# Patient Record
Sex: Male | Born: 1960 | Race: White | Hispanic: No | Marital: Married | Smoking: Never smoker
Health system: Southern US, Community
[De-identification: ages and names within clinical notes are randomized; demographics above are authoritative.]

## PROBLEM LIST (undated history)

## (undated) DIAGNOSIS — R4586 Emotional lability: Secondary | ICD-10-CM

## (undated) DIAGNOSIS — Z86010 Personal history of colonic polyps: Secondary | ICD-10-CM

## (undated) DIAGNOSIS — R51 Headache: Secondary | ICD-10-CM

## (undated) DIAGNOSIS — K219 Gastro-esophageal reflux disease without esophagitis: Secondary | ICD-10-CM

## (undated) DIAGNOSIS — E781 Pure hyperglyceridemia: Secondary | ICD-10-CM

## (undated) DIAGNOSIS — J309 Allergic rhinitis, unspecified: Secondary | ICD-10-CM

## (undated) DIAGNOSIS — E785 Hyperlipidemia, unspecified: Secondary | ICD-10-CM

## (undated) HISTORY — DX: Hyperlipidemia, unspecified: E78.5

## (undated) HISTORY — DX: Personal history of colonic polyps: Z86.010

## (undated) HISTORY — DX: Allergic rhinitis, unspecified: J30.9

## (undated) HISTORY — PX: EYE SURGERY: SHX253

## (undated) HISTORY — DX: Gastro-esophageal reflux disease without esophagitis: K21.9

## (undated) HISTORY — DX: Pure hyperglyceridemia: E78.1

---

## 1995-05-16 HISTORY — PX: OTHER SURGICAL HISTORY: SHX169

## 2000-06-06 ENCOUNTER — Ambulatory Visit (HOSPITAL_BASED_OUTPATIENT_CLINIC_OR_DEPARTMENT_OTHER): Admission: RE | Admit: 2000-06-06 | Discharge: 2000-06-06 | Payer: Self-pay | Admitting: Otolaryngology

## 2000-07-11 ENCOUNTER — Encounter (INDEPENDENT_AMBULATORY_CARE_PROVIDER_SITE_OTHER): Payer: Self-pay | Admitting: Specialist

## 2000-07-11 ENCOUNTER — Other Ambulatory Visit: Admission: RE | Admit: 2000-07-11 | Discharge: 2000-07-11 | Payer: Self-pay | Admitting: Otolaryngology

## 2002-09-05 ENCOUNTER — Encounter: Payer: Self-pay | Admitting: Internal Medicine

## 2002-09-05 ENCOUNTER — Encounter: Admission: RE | Admit: 2002-09-05 | Discharge: 2002-09-05 | Payer: Self-pay | Admitting: Internal Medicine

## 2004-02-24 ENCOUNTER — Encounter: Admission: RE | Admit: 2004-02-24 | Discharge: 2004-02-24 | Payer: Self-pay | Admitting: General Surgery

## 2004-10-13 ENCOUNTER — Ambulatory Visit: Payer: Self-pay | Admitting: Adult Health

## 2005-05-12 ENCOUNTER — Ambulatory Visit: Payer: Self-pay | Admitting: Internal Medicine

## 2005-05-17 ENCOUNTER — Ambulatory Visit: Payer: Self-pay | Admitting: Internal Medicine

## 2006-09-04 ENCOUNTER — Ambulatory Visit: Payer: Self-pay | Admitting: Internal Medicine

## 2006-09-04 LAB — CONVERTED CEMR LAB
ALT: 30 units/L (ref 0–40)
AST: 23 units/L (ref 0–37)
Albumin: 3.8 g/dL (ref 3.5–5.2)
Alkaline Phosphatase: 67 units/L (ref 39–117)
BUN: 10 mg/dL (ref 6–23)
Basophils Absolute: 0 10*3/uL (ref 0.0–0.1)
Basophils Relative: 0.9 % (ref 0.0–1.0)
Bilirubin Urine: NEGATIVE
Bilirubin, Direct: 0.1 mg/dL (ref 0.0–0.3)
CO2: 31 meq/L (ref 19–32)
Calcium: 9.1 mg/dL (ref 8.4–10.5)
Chloride: 107 meq/L (ref 96–112)
Cholesterol: 193 mg/dL (ref 0–200)
Creatinine, Ser: 0.9 mg/dL (ref 0.4–1.5)
Direct LDL: 67 mg/dL
Eosinophils Absolute: 0.1 10*3/uL (ref 0.0–0.6)
Eosinophils Relative: 2.9 % (ref 0.0–5.0)
GFR calc Af Amer: 118 mL/min
GFR calc non Af Amer: 97 mL/min
Glucose, Bld: 97 mg/dL (ref 70–99)
HCT: 44.4 % (ref 39.0–52.0)
HDL: 34.7 mg/dL — ABNORMAL LOW (ref >39.0)
Hemoglobin, Urine: NEGATIVE
Hemoglobin: 14.9 g/dL (ref 13.0–17.0)
Ketones, ur: NEGATIVE mg/dL
Leukocytes, UA: NEGATIVE
Lymphocytes Relative: 34.4 % (ref 12.0–46.0)
MCHC: 33.6 g/dL (ref 30.0–36.0)
MCV: 86.6 fL (ref 78.0–100.0)
Monocytes Absolute: 0.4 10*3/uL (ref 0.2–0.7)
Monocytes Relative: 7.9 % (ref 3.0–11.0)
Neutro Abs: 2.6 10*3/uL (ref 1.4–7.7)
Neutrophils Relative %: 53.9 % (ref 43.0–77.0)
Nitrite: NEGATIVE
PSA: 0.51 ng/mL (ref 0.10–4.00)
Platelets: 147 10*3/uL — ABNORMAL LOW (ref 150–400)
Potassium: 3.9 meq/L (ref 3.5–5.1)
RBC: 5.13 M/uL (ref 4.22–5.81)
RDW: 11.6 % (ref 11.5–14.6)
Sodium: 142 meq/L (ref 135–145)
Specific Gravity, Urine: 1.025 (ref 1.000–1.03)
TSH: 1.09 microintl units/mL (ref 0.35–5.50)
Total Bilirubin: 0.8 mg/dL (ref 0.3–1.2)
Total CHOL/HDL Ratio: 5.6
Total Protein, Urine: NEGATIVE mg/dL
Total Protein: 7.1 g/dL (ref 6.0–8.3)
Triglycerides: 610 mg/dL (ref 0–149)
Urine Glucose: NEGATIVE mg/dL
Urobilinogen, UA: 0.2 (ref 0.0–1.0)
VLDL: 122 mg/dL — ABNORMAL HIGH (ref 0–40)
WBC: 4.8 10*3/uL (ref 4.5–10.5)
pH: 6 (ref 5.0–8.0)

## 2007-07-05 ENCOUNTER — Encounter: Payer: Self-pay | Admitting: Internal Medicine

## 2007-08-26 ENCOUNTER — Encounter: Payer: Self-pay | Admitting: Internal Medicine

## 2007-09-05 ENCOUNTER — Encounter: Payer: Self-pay | Admitting: Internal Medicine

## 2007-11-11 ENCOUNTER — Encounter: Payer: Self-pay | Admitting: Internal Medicine

## 2008-08-31 ENCOUNTER — Encounter: Payer: Self-pay | Admitting: Internal Medicine

## 2008-10-05 ENCOUNTER — Ambulatory Visit: Payer: Self-pay | Admitting: Internal Medicine

## 2008-10-05 LAB — CONVERTED CEMR LAB
ALT: 25 units/L (ref 0–53)
AST: 24 units/L (ref 0–37)
Albumin: 4.2 g/dL (ref 3.5–5.2)
Alkaline Phosphatase: 57 units/L (ref 39–117)
BUN: 18 mg/dL (ref 6–23)
Basophils Absolute: 0 10*3/uL (ref 0.0–0.1)
Basophils Relative: 0.7 % (ref 0.0–3.0)
Bilirubin Urine: NEGATIVE
Bilirubin, Direct: 0.1 mg/dL (ref 0.0–0.3)
CO2: 29 meq/L (ref 19–32)
Calcium: 9.2 mg/dL (ref 8.4–10.5)
Chloride: 106 meq/L (ref 96–112)
Cholesterol: 186 mg/dL (ref 0–200)
Creatinine, Ser: 1.1 mg/dL (ref 0.4–1.5)
Eosinophils Absolute: 0.1 10*3/uL (ref 0.0–0.7)
Eosinophils Relative: 2.6 % (ref 0.0–5.0)
GFR calc non Af Amer: 76.34 mL/min (ref >60)
Glucose, Bld: 88 mg/dL (ref 70–99)
HCT: 41.7 % (ref 39.0–52.0)
HDL: 35.7 mg/dL — ABNORMAL LOW (ref >39.00)
Hemoglobin, Urine: NEGATIVE
Hemoglobin: 14.7 g/dL (ref 13.0–17.0)
Ketones, ur: NEGATIVE mg/dL
LDL Cholesterol: 115 mg/dL — ABNORMAL HIGH (ref 0–99)
Leukocytes, UA: NEGATIVE
Lymphocytes Relative: 40.4 % (ref 12.0–46.0)
Lymphs Abs: 1.9 10*3/uL (ref 0.7–4.0)
MCHC: 35.1 g/dL (ref 30.0–36.0)
MCV: 86.3 fL (ref 78.0–100.0)
Monocytes Absolute: 0.4 10*3/uL (ref 0.1–1.0)
Monocytes Relative: 9 % (ref 3.0–12.0)
Neutro Abs: 2.2 10*3/uL (ref 1.4–7.7)
Neutrophils Relative %: 47.3 % (ref 43.0–77.0)
Nitrite: NEGATIVE
PSA: 0.46 ng/mL (ref 0.10–4.00)
Platelets: 122 10*3/uL — ABNORMAL LOW (ref 150.0–400.0)
Potassium: 3.8 meq/L (ref 3.5–5.1)
RBC: 4.83 M/uL (ref 4.22–5.81)
RDW: 11.2 % — ABNORMAL LOW (ref 11.5–14.6)
Sodium: 141 meq/L (ref 135–145)
Specific Gravity, Urine: >=1.03 (ref 1.000–1.030)
TSH: 1.49 microintl units/mL (ref 0.35–5.50)
Total Bilirubin: 1.3 mg/dL — ABNORMAL HIGH (ref 0.3–1.2)
Total CHOL/HDL Ratio: 5
Total Protein, Urine: NEGATIVE mg/dL
Total Protein: 7.3 g/dL (ref 6.0–8.3)
Triglycerides: 175 mg/dL — ABNORMAL HIGH (ref 0.0–149.0)
Urine Glucose: NEGATIVE mg/dL
Urobilinogen, UA: 0.2 (ref 0.0–1.0)
VLDL: 35 mg/dL (ref 0.0–40.0)
WBC: 4.6 10*3/uL (ref 4.5–10.5)
pH: 5 (ref 5.0–8.0)

## 2008-10-07 ENCOUNTER — Ambulatory Visit: Payer: Self-pay | Admitting: Internal Medicine

## 2008-10-07 DIAGNOSIS — E785 Hyperlipidemia, unspecified: Secondary | ICD-10-CM

## 2008-10-07 DIAGNOSIS — Z860100 Personal history of colon polyps, unspecified: Secondary | ICD-10-CM | POA: Insufficient documentation

## 2008-10-07 DIAGNOSIS — J309 Allergic rhinitis, unspecified: Secondary | ICD-10-CM

## 2008-10-07 DIAGNOSIS — K219 Gastro-esophageal reflux disease without esophagitis: Secondary | ICD-10-CM

## 2008-10-07 HISTORY — DX: Gastro-esophageal reflux disease without esophagitis: K21.9

## 2008-10-07 HISTORY — DX: Hyperlipidemia, unspecified: E78.5

## 2008-10-07 HISTORY — DX: Personal history of colonic polyps: Z86.010

## 2008-10-07 HISTORY — DX: Allergic rhinitis, unspecified: J30.9

## 2008-10-07 HISTORY — DX: Personal history of colon polyps, unspecified: Z86.0100

## 2009-01-20 ENCOUNTER — Telehealth: Payer: Self-pay | Admitting: Internal Medicine

## 2009-01-21 ENCOUNTER — Telehealth: Payer: Self-pay | Admitting: Internal Medicine

## 2009-01-27 ENCOUNTER — Telehealth: Payer: Self-pay | Admitting: Internal Medicine

## 2009-01-29 ENCOUNTER — Encounter: Payer: Self-pay | Admitting: Internal Medicine

## 2009-02-08 ENCOUNTER — Telehealth: Payer: Self-pay | Admitting: Internal Medicine

## 2009-03-09 ENCOUNTER — Telehealth (INDEPENDENT_AMBULATORY_CARE_PROVIDER_SITE_OTHER): Payer: Self-pay | Admitting: *Deleted

## 2009-10-19 ENCOUNTER — Telehealth: Payer: Self-pay | Admitting: Internal Medicine

## 2009-11-22 ENCOUNTER — Ambulatory Visit: Payer: Self-pay | Admitting: Internal Medicine

## 2009-11-22 ENCOUNTER — Telehealth: Payer: Self-pay | Admitting: Internal Medicine

## 2009-11-22 DIAGNOSIS — R109 Unspecified abdominal pain: Secondary | ICD-10-CM

## 2009-11-23 LAB — CONVERTED CEMR LAB
ALT: 31 units/L (ref 0–53)
AST: 20 units/L (ref 0–37)
Albumin: 4.2 g/dL (ref 3.5–5.2)
Alkaline Phosphatase: 64 units/L (ref 39–117)
BUN: 17 mg/dL (ref 6–23)
Basophils Absolute: 0 10*3/uL (ref 0.0–0.1)
Basophils Relative: 0.3 % (ref 0.0–3.0)
Bilirubin Urine: NEGATIVE
Bilirubin, Direct: 0.1 mg/dL (ref 0.0–0.3)
CO2: 30 meq/L (ref 19–32)
Calcium: 9.2 mg/dL (ref 8.4–10.5)
Chloride: 106 meq/L (ref 96–112)
Cholesterol: 209 mg/dL — ABNORMAL HIGH (ref 0–200)
Creatinine, Ser: 1.1 mg/dL (ref 0.4–1.5)
Direct LDL: 104 mg/dL
Eosinophils Absolute: 0.1 10*3/uL (ref 0.0–0.7)
Eosinophils Relative: 1.7 % (ref 0.0–5.0)
GFR calc non Af Amer: 76.77 mL/min (ref >60)
Glucose, Bld: 94 mg/dL (ref 70–99)
HCT: 42.1 % (ref 39.0–52.0)
HDL: 36.7 mg/dL — ABNORMAL LOW (ref >39.00)
Hemoglobin, Urine: NEGATIVE
Hemoglobin: 14.6 g/dL (ref 13.0–17.0)
Ketones, ur: NEGATIVE mg/dL
Leukocytes, UA: NEGATIVE
Lymphocytes Relative: 34.7 % (ref 12.0–46.0)
Lymphs Abs: 2.2 10*3/uL (ref 0.7–4.0)
MCHC: 34.7 g/dL (ref 30.0–36.0)
MCV: 88.3 fL (ref 78.0–100.0)
Monocytes Absolute: 0.5 10*3/uL (ref 0.1–1.0)
Monocytes Relative: 8.4 % (ref 3.0–12.0)
Neutro Abs: 3.4 10*3/uL (ref 1.4–7.7)
Neutrophils Relative %: 54.9 % (ref 43.0–77.0)
Nitrite: NEGATIVE
PSA: 0.49 ng/mL (ref 0.10–4.00)
Platelets: 140 10*3/uL — ABNORMAL LOW (ref 150.0–400.0)
Potassium: 4.2 meq/L (ref 3.5–5.1)
RBC: 4.77 M/uL (ref 4.22–5.81)
RDW: 12.3 % (ref 11.5–14.6)
Sodium: 142 meq/L (ref 135–145)
Specific Gravity, Urine: 1.025 (ref 1.000–1.030)
TSH: 1.54 microintl units/mL (ref 0.35–5.50)
Total Bilirubin: 0.5 mg/dL (ref 0.3–1.2)
Total CHOL/HDL Ratio: 6
Total Protein, Urine: NEGATIVE mg/dL
Total Protein: 7.2 g/dL (ref 6.0–8.3)
Triglycerides: 553 mg/dL — ABNORMAL HIGH (ref 0.0–149.0)
Urine Glucose: NEGATIVE mg/dL
Urobilinogen, UA: 0.2 (ref 0.0–1.0)
VLDL: 110.6 mg/dL — ABNORMAL HIGH (ref 0.0–40.0)
WBC: 6.3 10*3/uL (ref 4.5–10.5)
pH: 6 (ref 5.0–8.0)

## 2009-11-24 ENCOUNTER — Encounter: Payer: Self-pay | Admitting: Internal Medicine

## 2009-11-24 ENCOUNTER — Ambulatory Visit: Payer: Self-pay | Admitting: Internal Medicine

## 2009-11-24 DIAGNOSIS — R933 Abnormal findings on diagnostic imaging of other parts of digestive tract: Secondary | ICD-10-CM

## 2009-11-25 ENCOUNTER — Encounter (INDEPENDENT_AMBULATORY_CARE_PROVIDER_SITE_OTHER): Payer: Self-pay | Admitting: *Deleted

## 2010-06-04 ENCOUNTER — Encounter: Payer: Self-pay | Admitting: General Surgery

## 2010-06-14 NOTE — Letter (Signed)
Summary: New Patient letter  [facility]  [address] [place], [place] [address]   Phone: [phone]  Fax: [phone]       11/25/2009 MRN: [id number]  [facility] [place] [address] [place], [place]  [address]  Dear Mr. CHENIER,  Welcome to the Gastroenterology Division at [facility].    You are scheduled to see Dr.  Sheryn Bison on January 04, 2010 at  9:00am on the 3rd floor at [facility], [address]  We ask that you try to arrive at our office 15 minutes prior to your appointment time to allow for check-in.  We would like you to complete the enclosed self-administered evaluation form prior to your visit and bring it with you on the day of your appointment.  We will review it with you.  Also, please bring a complete list of all your medications or, if you prefer, bring the medication bottles and we will list them.  Please bring your insurance card so that we may make a copy of it.  If your insurance requires a referral to see a specialist, please bring your referral form from your primary care physician.  Co-payments are due at the time of your visit and may be paid by cash, check or credit card.     Your office visit will consist of a consult with your physician (includes a physical exam), any laboratory testing he/she may order, scheduling of any necessary diagnostic testing (e.g. x-ray, ultrasound, CT-scan), and scheduling of a procedure (e.g. Endoscopy, Colonoscopy) if required.  Please allow enough time on your schedule to allow for any/all of these possibilities.    If you cannot keep your appointment, please call [phone] to cancel or reschedule prior to your appointment date.  This allows [place] the opportunity to schedule an appointment for another patient in need of care.  If you do not cancel or reschedule by 5 p.m. the business day prior to your appointment date, you will be charged a $50.00 late cancellation/no-show fee.    Thank you for  choosing [facility] for your medical needs.  We appreciate the opportunity to care for you.  Please visit [place] at our website  to learn more about our practice.                     Sincerely,                                                             The Gastroenterology Division

## 2010-06-14 NOTE — Assessment & Plan Note (Signed)
Summary: BOWEL ISSUE---STC   Vital Signs:  Patient profile:   50 year old male Height:      72 inches Weight:      205.25 pounds BMI:     27.94 O2 Sat:      96 % on Room air Temp:     98.3 degrees F oral Pulse rate:   68 / minute BP sitting:   106 / 70  (left arm) Cuff size:   regular  Vitals Entered By: Zella Ball Ewing CMA (AAMA) (November 22, 2009 2:27 PM)  O2 Flow:  Room air  CC: Lower abdominal pain for 10 days/RE   CC:  Lower abdominal pain for 10 days/RE.  History of Present Illness: here with 7-14 days gradually worsening lower abd pain, now moderate;  right side and groin only;  intermittent, better overal lto lie down, soimetimes worse to stand possibly but not sure b/c occurs while sitting and walking; no problems with bending and lifting the 80 mo old daughter;  not sure of  quality but not sharp;  has flares up to 7/10 that last 5 to 10 min several times per day;  has had usually irreg pattern of BM's  but BM now cause no improvement;  no BRBPR; does have greater relief than usualy of full bladder after urination,  but no dysuria, freq, urgency or hematuria .  Has ongoing recurrent LBP for which he sees chiropracter , but has had some ? increased pain to right flank and lower back as well.  No n/v, fever, chills.    wants to hold on Ct due to cost if possible  Problems Prior to Update: 1)  Abdominal Pain, Lower  (ICD-789.09) 2)  Preventive Health Care  (ICD-V70.0) 3)  Allergic Rhinitis  (ICD-477.9) 4)  Hyperlipidemia  (ICD-272.4) 5)  Gerd  (ICD-530.81) 6)  Colonic Polyps, Hx of  (ICD-V12.72)  Medications Prior to Update: 1)  Nasacort Aq 55 Mcg/act Aers (Triamcinolone Acetonide(Nasal)) .... 2 Sprays Each Nostril 2)  Omeprazole 20 Mg Tbec (Omeprazole) .Marland Kitchen.. 1 By Mouth Two Times A Day  Current Medications (verified): 1)  Nasacort Aq 55 Mcg/act Aers (Triamcinolone Acetonide(Nasal)) .... 2 Sprays Each Nostril 2)  Omeprazole 20 Mg Tbec (Omeprazole) .Marland Kitchen.. 1 By Mouth Two Times A  Day 3)  Ciprofloxacin Hcl 500 Mg Tabs (Ciprofloxacin Hcl) .Marland Kitchen.. 1po Two Times A Day  Allergies (verified): 1)  ! 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 Smoked Alcohol use-yes - rare  Risk Factors: Smoking Status: never (10/07/2008)  Past Medical History: Colonic polyps, hx of - hyperplastic april 2010 (but previous adenoma) - dr Matthias Hughs GERD Hyperlipidemia Allergic rhinitis  Review of Systems  The patient denies anorexia, fever, weight loss, weight gain, vision loss, decreased hearing, hoarseness, chest pain, syncope, dyspnea on exertion, peripheral edema, prolonged cough, headaches, hemoptysis, hematochezia, severe indigestion/heartburn, hematuria, incontinence, muscle weakness, suspicious skin lesions, transient blindness, difficulty walking, depression, unusual weight change, abnormal bleeding, enlarged lymph nodes, and angioedema.         all otherwise negative per pt -    Physical Exam  General:  alert and overweight-appearing.   Head:  normocephalic and atraumatic.   Eyes:  vision grossly intact, pupils equal, and pupils round.   Ears:  R  ear normal and L ear normal.   Nose:  no external deformity and no nasal discharge.   Mouth:  good dentition and pharynx pink and moist.   Neck:  supple and no masses.   Lungs:  normal respiratory effort and normal breath sounds.   Heart:  normal rate and regular rhythm.   Abdomen:  soft and normal bowel sounds.  with mid to right lower mid abd tender, without guarding or rebound Genitalia:  no inguinal swelling or herniaor tenderness Msk:  no joint tenderness and no joint swelling.   Extremities:  no edema, no erythema  Neurologic:  cranial nerves  II-XII intact and strength normal in all extremities.   Skin:  no rashes.     Impression & Recommendations:  Problem # 1:  Preventive Health Care (ICD-V70.0)  Overall doing well, age appropriate education and counseling updated and referral for appropriate preventive services done unless declined, immunizations up to date or declined, diet counseling done if overweight, urged to quit smoking if smokes , most recent labs reviewed and current ordered if appropriate, ecg reviewed or declined (interpretation per ECG scanned in the EMR if done); information regarding Medicare Prevention requirements given if appropriate; speciality referrals updated as appropriate   Orders: TLB-BMP (Basic Metabolic Panel-BMET) (80048-METABOL) TLB-CBC Platelet - w/Differential (85025-CBCD) TLB-Hepatic/Liver Function Pnl (80076-HEPATIC) TLB-Lipid Panel (80061-LIPID) TLB-PSA (Prostate Specific Antigen) (84153-PSA)  Problem # 2:  ABDOMINAL PAIN, LOWER (ICD-789.09) if cystitis, will tx with cipro,  o/w will need CT abd/pelvis Orders: T-Culture, Urine ([id number]) TLB-Udip w/ Micro (81001-URINE)  Complete Medication List: 1)  Nasacort Aq 55 Mcg/act Aers (Triamcinolone acetonide(nasal)) .... 2 sprays each nostril 2)  Omeprazole 20 Mg Tbec (Omeprazole) .Marland Kitchen.. 1 by mouth two times a day 3)  Ciprofloxacin Hcl 500 Mg Tabs (Ciprofloxacin hcl) .Marland Kitchen.. 1po two times a day  Patient Instructions: 1)  Please go to the Lab in the basement for your blood and/or urine tests today 2)  Please take all new medications as prescribed 3)  You will be called if it seems we need to order the CT scan (depending on the blood and urine tests) 4)  Please schedule a follow-up appointment in 1 year or sooner if needed Prescriptions: CIPROFLOXACIN HCL 500 MG TABS (CIPROFLOXACIN HCL) 1po two times a day  #20 x 0   Entered and Authorized by:   Corwin Levins MD   Signed by:   Corwin Levins MD on 11/22/2009   Method used:   Print then Give to  Patient   RxID:   [id number]   Appended Document: BOWEL ISSUE---STC robin to call pt - UA neg, ok to cont the cipro (which is generic and $4 cash at [facility] or [facility]), and we will need CT to further asess - I will order  Appended Document: BOWEL ISSUE---STC called pt informed of above information.

## 2010-06-14 NOTE — Miscellaneous (Signed)
Summary: Orders Update   Clinical Lists Changes  Problems: Added new problem of NONSPECIFIC ABN FINDING RAD & OTH EXAM GI TRACT (ICD-793.4) Orders: Added new Referral order of Gastroenterology Referral (GI) - Signed

## 2010-06-14 NOTE — Progress Notes (Signed)
Summary: CHANGE CIPRO TO SOMETHING ELSE WITH A GENERIC  Phone Note Call from Patient Call back at [facility] Phone [phone]   Caller: Patient Summary of Call: PT HAD AN APPT TODAY AT 2:15.  HE WANTS THE CIPRO FLOXACIN CHANGED TO SOMETHING THAT HAS A GENERIC BECAUSE HIS INSURANCE ONLY CHARGES $10.00  FOR GENERIC. [facility]ACY IS [facility] [address] PATIENTS PHONE #: [phone] Initial call taken by: Hilarie Fredrickson,  November 22, 2009 3:51 PM  Follow-up for Phone Call        cipro is generic Follow-up by: Corwin Levins MD,  November 22, 2009 5:23 PM  Additional Follow-up for Phone Call Additional follow up Details #1::        Pt aware, states that when he took Rx to pharmacy he was advised by pharmacist Additional Follow-up by: Margaret Pyle, CMA,  November 23, 2009 8:03 AM

## 2010-06-14 NOTE — Miscellaneous (Signed)
Summary: Orders Update   Clinical Lists Changes  Orders: Added new Referral order of Radiology Referral (Radiology) - Signed 

## 2010-06-14 NOTE — Progress Notes (Signed)
Summary: Lansoprazole PA  Phone Note From Pharmacy   Summary of Call: PA request--Lansoprazole. Has the patient tried and failed both Omeprazole & Nexium? Please advise. Initial call taken by: Lucious Groves,  October 19, 2009 4:40 PM  Follow-up for Phone Call        call tp - ? ok to change to omeprazole 20 mg - 2 per day? since insurance will not pay for the lansoprazole Follow-up by: Corwin Levins MD,  October 19, 2009 4:55 PM  Additional Follow-up for Phone Call Additional follow up Details #1::        Pt will try Omeprazole 20mg  two times a day  Additional Follow-up by: Margaret Pyle, CMA,  October 20, 2009 8:42 AM    New/Updated Medications: OMEPRAZOLE 20 MG TBEC (OMEPRAZOLE) 1 by mouth two times a day Prescriptions: OMEPRAZOLE 20 MG TBEC (OMEPRAZOLE) 1 by mouth two times a day  #60 x 5   Entered by:   Margaret Pyle, CMA   Authorized by:   Corwin Levins MD   Signed by:   Margaret Pyle, CMA on 10/20/2009   Method used:   Electronically to        [facility] [phone]* (retail)       [address]       [place], [place]  [address]       Ph: [phone] or [phone]       Fax: [phone]   RxID:   [id number]

## 2010-06-14 NOTE — Letter (Signed)
Summary: [facility] Consult Scheduled Letter  [facility]  [address] [place], [place] [address]   Phone: [phone]  Fax: [phone]      11/25/2009 MRN: [id number]  [facility] [place] [address] [place], [place]  [address]    Dear Mr. PANGILINAN,      We have scheduled an appointment for you.  At the recommendation of Dr.John, we have scheduled you a consult with Dr Jarold Motto on 01/04/10 at 9:00am.  Their phone number is [phone].  If this appointment day and time is not convenient for you, please feel free to call the office of the doctor you are being referred to at the number listed above and reschedule the appointment.     [facility] [address] [place], [place] [address] *Gastroenterology Dept.3rd Floor*   Please give 24hr notice if you need to cancel/reschedule to a void a $50.00 fee.Also bring insurance card and any co-pay due at time of visit.     Thank you,  Patient Care Coordinator  [facility]

## 2010-06-15 ENCOUNTER — Encounter (INDEPENDENT_AMBULATORY_CARE_PROVIDER_SITE_OTHER): Payer: Self-pay | Admitting: *Deleted

## 2010-06-15 ENCOUNTER — Ambulatory Visit: Admit: 2010-06-15 | Payer: Self-pay | Admitting: Internal Medicine

## 2010-06-15 ENCOUNTER — Other Ambulatory Visit: Payer: MEDICARE

## 2010-06-15 ENCOUNTER — Other Ambulatory Visit: Payer: Self-pay

## 2010-06-15 ENCOUNTER — Other Ambulatory Visit: Payer: Self-pay | Admitting: Internal Medicine

## 2010-06-15 DIAGNOSIS — Z0000 Encounter for general adult medical examination without abnormal findings: Secondary | ICD-10-CM

## 2010-06-15 DIAGNOSIS — E785 Hyperlipidemia, unspecified: Secondary | ICD-10-CM

## 2010-06-15 LAB — CBC WITH DIFFERENTIAL/PLATELET
Basophils Absolute: 0 10*3/uL (ref 0.0–0.1)
Basophils Relative: 0.5 % (ref 0.0–3.0)
Eosinophils Absolute: 0.1 10*3/uL (ref 0.0–0.7)
Eosinophils Relative: 1.7 % (ref 0.0–5.0)
HCT: 43.8 % (ref 39.0–52.0)
Hemoglobin: 14.9 g/dL (ref 13.0–17.0)
Lymphocytes Relative: 41.5 % (ref 12.0–46.0)
Lymphs Abs: 2.1 10*3/uL (ref 0.7–4.0)
MCHC: 34.1 g/dL (ref 30.0–36.0)
MCV: 88.4 fl (ref 78.0–100.0)
Monocytes Absolute: 0.3 10*3/uL (ref 0.1–1.0)
Monocytes Relative: 6.9 % (ref 3.0–12.0)
Neutro Abs: 2.5 10*3/uL (ref 1.4–7.7)
Neutrophils Relative %: 49.4 % (ref 43.0–77.0)
Platelets: 126 10*3/uL — ABNORMAL LOW (ref 150.0–400.0)
RBC: 4.96 Mil/uL (ref 4.22–5.81)
RDW: 12.4 % (ref 11.5–14.6)
WBC: 5 10*3/uL (ref 4.5–10.5)

## 2010-06-15 LAB — HEPATIC FUNCTION PANEL
ALT: 26 U/L (ref 0–53)
AST: 22 U/L (ref 0–37)
Albumin: 4.1 g/dL (ref 3.5–5.2)
Alkaline Phosphatase: 76 U/L (ref 39–117)
Bilirubin, Direct: 0.1 mg/dL (ref 0.0–0.3)
Total Bilirubin: 0.6 mg/dL (ref 0.3–1.2)
Total Protein: 7.1 g/dL (ref 6.0–8.3)

## 2010-06-15 LAB — URINALYSIS
Bilirubin Urine: NEGATIVE
Hgb urine dipstick: NEGATIVE
Ketones, ur: NEGATIVE
Leukocytes, UA: NEGATIVE
Nitrite: NEGATIVE
Specific Gravity, Urine: 1.01 (ref 1.000–1.030)
Total Protein, Urine: NEGATIVE
Urine Glucose: NEGATIVE
Urobilinogen, UA: 0.2 (ref 0.0–1.0)
pH: 6 (ref 5.0–8.0)

## 2010-06-15 LAB — BASIC METABOLIC PANEL
BUN: 13 mg/dL (ref 6–23)
CO2: 31 mEq/L (ref 19–32)
Calcium: 9.3 mg/dL (ref 8.4–10.5)
Chloride: 106 mEq/L (ref 96–112)
Creatinine, Ser: 1 mg/dL (ref 0.4–1.5)
GFR: 88.68 mL/min (ref >60.00)
Glucose, Bld: 66 mg/dL — ABNORMAL LOW (ref 70–99)
Potassium: 4.2 mEq/L (ref 3.5–5.1)
Sodium: 142 mEq/L (ref 135–145)

## 2010-06-15 LAB — LIPID PANEL
Cholesterol: 164 mg/dL (ref 0–200)
HDL: 34.6 mg/dL — ABNORMAL LOW (ref >39.00)
Total CHOL/HDL Ratio: 5
Triglycerides: 283 mg/dL — ABNORMAL HIGH (ref 0.0–149.0)
VLDL: 56.6 mg/dL — ABNORMAL HIGH (ref 0.0–40.0)

## 2010-06-15 LAB — TSH: TSH: 1.12 u[IU]/mL (ref 0.35–5.50)

## 2010-06-15 LAB — LDL CHOLESTEROL, DIRECT: Direct LDL: 91.9 mg/dL

## 2010-06-21 ENCOUNTER — Encounter: Payer: Self-pay | Admitting: Internal Medicine

## 2010-06-21 ENCOUNTER — Encounter (INDEPENDENT_AMBULATORY_CARE_PROVIDER_SITE_OTHER): Payer: 59 | Admitting: Internal Medicine

## 2010-06-21 DIAGNOSIS — J029 Acute pharyngitis, unspecified: Secondary | ICD-10-CM

## 2010-06-21 DIAGNOSIS — L049 Acute lymphadenitis, unspecified: Secondary | ICD-10-CM

## 2010-06-21 DIAGNOSIS — R5381 Other malaise: Secondary | ICD-10-CM

## 2010-06-21 DIAGNOSIS — R5383 Other fatigue: Secondary | ICD-10-CM

## 2010-06-21 DIAGNOSIS — J309 Allergic rhinitis, unspecified: Secondary | ICD-10-CM

## 2010-06-21 DIAGNOSIS — Z0000 Encounter for general adult medical examination without abnormal findings: Secondary | ICD-10-CM

## 2010-06-30 NOTE — Assessment & Plan Note (Signed)
Summary: CPX/[employer]/#/CD   Vital Signs:  Patient profile:   50 year old male Height:      72 inches Weight:      204.38 pounds BMI:     27.82 O2 Sat:      97 % on Room air Temp:     97.8 degrees F oral Pulse rate:   74 / minute BP sitting:   100 / 60  (left arm) Cuff size:   regular  Vitals Entered By: Zella Ball Ewing CMA (AAMA) (June 21, 2010 9:42 AM)  O2 Flow:  Room air  CC: Adult Physical/RE   CC:  Adult Physical/RE.  History of Present Illness: here for f/u, overall doing ok;  Pt denies CP, worsening sob, doe, wheezing, orthopnea, pnd, worsening LE edema, palps, dizziness or syncope  Pt denies new neuro symptoms such as headache, facial or extremity weakness  Pt denies polydipsia, polyuria Overall good compliance with meds, trying to follow low chol  diet, wt stable, little excercise however Denies worsening depressive symptoms, suicidal ideation, or panic.   Overall good compliance with meds, and good tolerability.  No recent wt loss, night sweats, loss of appetite or other constitutional symptoms. Does have ongoing nasal allergy symptoms with itch, sneeze, and clear d/c, without pain, fever, colored d/c , overall mild for several months, and current med too expensive. Has ongoing fatigue as well, nonspecific, without daytime somnolence.  Also incidently with 2-3 days low grade fever, ST, malaise and slight nonprod cough, with mild tender lumps to the right neck.    Preventive Screening-Counseling & Management      Drug Use:  no.    Problems Prior to Update: 1)  Acute Lymphadenitis  (ICD-683) 2)  Fatigue  (ICD-780.79) 3)  Pharyngitis-acute  (ICD-462) 4)  Nonspecific Abn Finding Rad & Oth Exam Gi Tract  (ICD-793.4) 5)  Abdominal Pain, Lower  (ICD-789.09) 6)  Preventive Health Care  (ICD-V70.0) 7)  Allergic Rhinitis  (ICD-477.9) 8)  Hyperlipidemia  (ICD-272.4) 9)  Gerd  (ICD-530.81) 10)  Colonic Polyps, Hx of  (ICD-V12.72)  Medications Prior to Update: 1)  Nasacort Aq 55  Mcg/act Aers (Triamcinolone Acetonide(Nasal)) .... 2 Sprays Each Nostril 2)  Omeprazole 20 Mg Tbec (Omeprazole) .Marland Kitchen.. 1 By Mouth Two Times A Day 3)  Ciprofloxacin Hcl 500 Mg Tabs (Ciprofloxacin Hcl) .Marland Kitchen.. 1po Two Times A Day  Current Medications (verified): 1)  Fluticasone Propionate 50 Mcg/act Susp (Fluticasone Propionate) .... 2 Spray/side Once Daily 2)  Omeprazole 20 Mg Tbec (Omeprazole) .Marland Kitchen.. 1 By Mouth Two Times A Day 3)  Azithromycin 250 Mg Tabs (Azithromycin) .... 2po Qd For 1 Day, Then 1po Qd For 4days, Then Stop  Allergies (verified): 1)  ! 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   Head:  normocephalic and atraumatic.   Eyes:  vision grossly intact, pupils equal, and pupils round.   Ears:  R ear normal and L ear normal.   Nose:  no external deformity and no nasal discharge.   Mouth:  pharyngeal erythema and fair dentition.   Neck:  supple and no masses.  except for several tender subq nodular probable LN to the right at the upper and lower aspect pre-SCM Lungs:  normal respiratory effort and normal breath sounds.   Heart:  normal rate and regular rhythm.   Abdomen:  soft and normal bowel  sounds. non-tender.   Msk:  no joint tenderness and no joint swelling.   Extremities:  no edema, no erythema  Neurologic:  cranial nerves II-XII intact and strength normal in all extremities.   Skin:  color normal and no rashes.   Psych:  not anxious appearing and not depressed appearing.     Impression & Recommendations:  Problem # 1:  ALLERGIC RHINITIS (ICD-477.9)  His updated medication list for this problem includes:    Fluticasone Propionate 50 Mcg/act Susp (Fluticasone propionate) .Marland Kitchen... 2 spray/side once daily treat as above, f/u any worsening signs or symptoms - nasacort too expensive  Problem # 2:  FATIGUE (ICD-780.79)  exam benign, recent labs reviewed; follow with expectant management   Orders: EKG w/ Interpretation (93000)  Problem # 3:  PHARYNGITIS-ACUTE (ICD-462)  His updated medication list for this problem includes:    Azithromycin 250 Mg Tabs (Azithromycin) .Marland Kitchen... 2po qd for 1 day, then 1po qd for 4days, then stop treat as above, f/u any worsening signs or symptoms;   Problem # 4:  ACUTE LYMPHADENITIS (ICD-683)  His updated medication list for this problem includes:    Azithromycin 250 Mg Tabs (Azithromycin) .Marland Kitchen... 2po qd for 1 day, then 1po qd for 4days, then stop I suspect this accounts for the neck lumps pre-SCM on the right;  pt to follow for resolution, f/u any worsening size or persistence to consider further eval and tx  Complete Medication List: 1)  Fluticasone Propionate 50 Mcg/act Susp (Fluticasone propionate) .... 2 spray/side once daily 2)  Omeprazole 20 Mg Tbec (Omeprazole) .Marland Kitchen.. 1 by mouth two times a day 3)  Azithromycin 250 Mg Tabs (Azithromycin) .... 2po qd for 1 day, then 1po qd for 4days, then stop  Patient Instructions: 1)  Please take all new medications as prescribed - the antibiotic 2)  Continue all previous medications as before this visit 3)  Please schedule a follow-up appointment in 1 year for CPX with  labs Prescriptions: FLUTICASONE PROPIONATE 50 MCG/ACT SUSP (FLUTICASONE PROPIONATE) 2 spray/side once daily  #3 x 3   Entered and Authorized by:   Corwin Levins MD   Signed by:   Corwin Levins MD on 06/21/2010   Method used:   Print then Give to Patient   RxID:   [id number] OMEPRAZOLE 20 MG TBEC (OMEPRAZOLE) 1 by mouth two times a day  #180 x 3   Entered and Authorized by:   Corwin Levins MD   Signed by:   Corwin Levins MD on 06/21/2010   Method used:   Print then Give to Patient   RxID:   [id number] AZITHROMYCIN 250 MG TABS (AZITHROMYCIN) 2po qd for 1 day, then 1po qd for 4days, then stop  #6 x 1   Entered and Authorized by:   Corwin Levins MD   Signed by:  Corwin Levins MD on 06/21/2010   Method used:   Print then Give to Patient   RxID:   [id number]    Orders Added: 1)  EKG w/ Interpretation [93000] 2)  EKG w/ Interpretation [93000] 3)  Est. Patient Level IV [14782]

## 2010-09-11 ENCOUNTER — Other Ambulatory Visit: Payer: Self-pay | Admitting: Internal Medicine

## 2010-09-22 ENCOUNTER — Other Ambulatory Visit: Payer: Self-pay | Admitting: Internal Medicine

## 2010-09-27 ENCOUNTER — Other Ambulatory Visit: Payer: Self-pay | Admitting: Internal Medicine

## 2010-09-30 ENCOUNTER — Other Ambulatory Visit: Payer: Self-pay

## 2010-09-30 MED ORDER — OMEPRAZOLE 20 MG PO CPDR: 20.0000 mg | DELAYED_RELEASE_CAPSULE | Freq: Two times a day (BID) | ORAL | Status: DC

## 2010-09-30 NOTE — Telephone Encounter (Signed)
Pharmacy called to request refill as patient had misplaced the hardcopy of prescription given at last OV.

## 2010-10-11 ENCOUNTER — Telehealth: Payer: Self-pay | Admitting: *Deleted

## 2010-10-11 NOTE — Telephone Encounter (Signed)
PA requested for: Omeprazole DR 20mg  capsule PA form requested from [employer] @ [phone] [10/04/10] Form recvd, completed & faxed for Approval [10/05/10]  Approval recvd & faxed to pharmacy [10/05/10]

## 2011-02-10 ENCOUNTER — Telehealth: Payer: Self-pay

## 2011-02-10 DIAGNOSIS — Z3009 Encounter for other general counseling and advice on contraception: Secondary | ICD-10-CM

## 2011-02-10 NOTE — Telephone Encounter (Signed)
Done per emr 

## 2011-02-10 NOTE — Telephone Encounter (Signed)
Patient would like a referral to a surgeon or urologist to have a vasectomy. Patient would like a call back to confirm referral has been done, call back number [phone]

## 2011-02-10 NOTE — Telephone Encounter (Signed)
Called patient informed referral has been completed as requested.

## 2011-04-21 ENCOUNTER — Telehealth: Payer: Self-pay

## 2011-04-21 DIAGNOSIS — Z1289 Encounter for screening for malignant neoplasm of other sites: Secondary | ICD-10-CM

## 2011-04-21 DIAGNOSIS — Z0000 Encounter for general adult medical examination without abnormal findings: Secondary | ICD-10-CM

## 2011-04-21 NOTE — Telephone Encounter (Signed)
Put order in for physical labs. 

## 2011-06-23 ENCOUNTER — Encounter: Payer: 59 | Admitting: Internal Medicine

## 2011-07-06 ENCOUNTER — Other Ambulatory Visit (INDEPENDENT_AMBULATORY_CARE_PROVIDER_SITE_OTHER): Payer: 59

## 2011-07-06 DIAGNOSIS — Z0000 Encounter for general adult medical examination without abnormal findings: Secondary | ICD-10-CM

## 2011-07-06 DIAGNOSIS — Z1289 Encounter for screening for malignant neoplasm of other sites: Secondary | ICD-10-CM

## 2011-07-06 LAB — CBC WITH DIFFERENTIAL/PLATELET
Basophils Absolute: 0 10*3/uL (ref 0.0–0.1)
Basophils Relative: 0.4 % (ref 0.0–3.0)
Eosinophils Absolute: 0.2 10*3/uL (ref 0.0–0.7)
Eosinophils Relative: 2.4 % (ref 0.0–5.0)
HCT: 44.5 % (ref 39.0–52.0)
Hemoglobin: 15.3 g/dL (ref 13.0–17.0)
Lymphocytes Relative: 37.7 % (ref 12.0–46.0)
Lymphs Abs: 2.4 10*3/uL (ref 0.7–4.0)
MCHC: 34.3 g/dL (ref 30.0–36.0)
MCV: 87.7 fl (ref 78.0–100.0)
Monocytes Absolute: 0.4 10*3/uL (ref 0.1–1.0)
Monocytes Relative: 6.4 % (ref 3.0–12.0)
Neutro Abs: 3.4 10*3/uL (ref 1.4–7.7)
Neutrophils Relative %: 53.1 % (ref 43.0–77.0)
Platelets: 143 10*3/uL — ABNORMAL LOW (ref 150.0–400.0)
RBC: 5.07 Mil/uL (ref 4.22–5.81)
RDW: 12 % (ref 11.5–14.6)
WBC: 6.4 10*3/uL (ref 4.5–10.5)

## 2011-07-06 LAB — URINALYSIS, ROUTINE W REFLEX MICROSCOPIC
Bilirubin Urine: NEGATIVE
Hgb urine dipstick: NEGATIVE
Ketones, ur: NEGATIVE
Leukocytes, UA: NEGATIVE
Nitrite: NEGATIVE
Specific Gravity, Urine: 1.015 (ref 1.000–1.030)
Total Protein, Urine: NEGATIVE
Urine Glucose: NEGATIVE
Urobilinogen, UA: 0.2 (ref 0.0–1.0)
pH: 6 (ref 5.0–8.0)

## 2011-07-07 LAB — LIPID PANEL
Cholesterol: 214 mg/dL — ABNORMAL HIGH (ref 0–200)
HDL: 31.6 mg/dL — ABNORMAL LOW (ref >39.00)
Total CHOL/HDL Ratio: 7
Triglycerides: 1106 mg/dL — ABNORMAL HIGH (ref 0.0–149.0)
VLDL: 221.2 mg/dL — ABNORMAL HIGH (ref 0.0–40.0)

## 2011-07-07 LAB — BASIC METABOLIC PANEL
BUN: 13 mg/dL (ref 6–23)
CO2: 27 mEq/L (ref 19–32)
Calcium: 9.1 mg/dL (ref 8.4–10.5)
Chloride: 102 mEq/L (ref 96–112)
Creatinine, Ser: 1.1 mg/dL (ref 0.4–1.5)
GFR: 79.62 mL/min (ref >60.00)
Glucose, Bld: 96 mg/dL (ref 70–99)
Potassium: 4.1 mEq/L (ref 3.5–5.1)
Sodium: 137 mEq/L (ref 135–145)

## 2011-07-07 LAB — LDL CHOLESTEROL, DIRECT: Direct LDL: 47.8 mg/dL

## 2011-07-07 LAB — HEPATIC FUNCTION PANEL
ALT: 31 U/L (ref 0–53)
AST: 22 U/L (ref 0–37)
Albumin: 4.3 g/dL (ref 3.5–5.2)
Alkaline Phosphatase: 79 U/L (ref 39–117)
Bilirubin, Direct: 0 mg/dL (ref 0.0–0.3)
Total Bilirubin: 0.2 mg/dL — ABNORMAL LOW (ref 0.3–1.2)
Total Protein: 7 g/dL (ref 6.0–8.3)

## 2011-07-07 LAB — PSA: PSA: 0.51 ng/mL (ref 0.10–4.00)

## 2011-07-07 LAB — TSH: TSH: 1.12 u[IU]/mL (ref 0.35–5.50)

## 2011-07-08 ENCOUNTER — Encounter: Payer: Self-pay | Admitting: Internal Medicine

## 2011-07-08 DIAGNOSIS — Z0000 Encounter for general adult medical examination without abnormal findings: Secondary | ICD-10-CM | POA: Insufficient documentation

## 2011-07-12 ENCOUNTER — Ambulatory Visit (INDEPENDENT_AMBULATORY_CARE_PROVIDER_SITE_OTHER): Payer: 59 | Admitting: Internal Medicine

## 2011-07-12 ENCOUNTER — Encounter: Payer: Self-pay | Admitting: Internal Medicine

## 2011-07-12 ENCOUNTER — Telehealth: Payer: Self-pay

## 2011-07-12 VITALS — BP 100/66 | HR 82 | Temp 97.6°F | Ht 72.0 in | Wt 204.0 lb

## 2011-07-12 DIAGNOSIS — E559 Vitamin D deficiency, unspecified: Secondary | ICD-10-CM | POA: Insufficient documentation

## 2011-07-12 DIAGNOSIS — E781 Pure hyperglyceridemia: Secondary | ICD-10-CM

## 2011-07-12 DIAGNOSIS — E041 Nontoxic single thyroid nodule: Secondary | ICD-10-CM

## 2011-07-12 DIAGNOSIS — J309 Allergic rhinitis, unspecified: Secondary | ICD-10-CM

## 2011-07-12 DIAGNOSIS — E785 Hyperlipidemia, unspecified: Secondary | ICD-10-CM

## 2011-07-12 DIAGNOSIS — K219 Gastro-esophageal reflux disease without esophagitis: Secondary | ICD-10-CM

## 2011-07-12 DIAGNOSIS — Z0000 Encounter for general adult medical examination without abnormal findings: Secondary | ICD-10-CM

## 2011-07-12 DIAGNOSIS — Z79899 Other long term (current) drug therapy: Secondary | ICD-10-CM

## 2011-07-12 HISTORY — DX: Pure hyperglyceridemia: E78.1

## 2011-07-12 MED ORDER — FENOFIBRATE 160 MG PO TABS: 160.0000 mg | ORAL_TABLET | Freq: Every day | ORAL | Status: DC

## 2011-07-12 MED ORDER — LANSOPRAZOLE 30 MG PO CPDR: 30.0000 mg | DELAYED_RELEASE_CAPSULE | Freq: Every day | ORAL | Status: DC

## 2011-07-12 MED ORDER — OMEPRAZOLE 20 MG PO CPDR: 20.0000 mg | DELAYED_RELEASE_CAPSULE | Freq: Every day | ORAL | Status: DC

## 2011-07-12 MED ORDER — TRIAMCINOLONE ACETONIDE(NASAL) 55 MCG/ACT NA INHA: 2.0000 | Freq: Every day | NASAL | Status: DC

## 2011-07-12 MED ORDER — VITAMIN D 400 UNITS PO CAPS: 400.0000 [IU] | ORAL_CAPSULE | Freq: Every day | ORAL | Status: AC

## 2011-07-12 MED ORDER — OMEPRAZOLE 20 MG PO CPDR: 20.0000 mg | DELAYED_RELEASE_CAPSULE | Freq: Two times a day (BID) | ORAL | Status: DC

## 2011-07-12 NOTE — Progress Notes (Signed)
Subjective:    Patient ID: Jonathan Mills, male    DOB: 07-01-1960, 51 y.o.   MRN: [id number]  HPI  Here for wellness and f/u;  Overall doing ok;  Pt denies CP, worsening SOB, DOE, wheezing, orthopnea, PND, worsening LE edema, palpitations, dizziness or syncope.  Pt denies neurological change such as new Headache, facial or extremity weakness.  Pt denies polydipsia, polyuria, or low sugar symptoms. Pt states overall good compliance with treatment and medications, good tolerability, and trying to follow lower cholesterol diet.  Pt denies worsening depressive symptoms, suicidal ideation or panic. No fever, wt loss, night sweats, loss of appetite, or other constitutional symptoms.  Pt states good ability with ADL's, low fall risk, home safety reviewed and adequate, no significant changes in hearing or vision, and occasionally active with exercise.  Did take align last yr with some improvement on pain per GI. Has been starting the meatless diet for lent for the next month.  Needs med refills today.  Has not tolerated flonase in the past -  Does have several wks ongoing nasal allergy symptoms with clear congestion, itch and sneeze, without fever, pain, ST, cough or wheezing. Past Medical History  Diagnosis Date  . Vitamin d deficiency 07/12/2011  . ALLERGIC RHINITIS 10/07/2008  . GERD 10/07/2008  . HYPERLIPIDEMIA 10/07/2008  . COLONIC POLYPS, HX OF 10/07/2008  . Hypertriglyceridemia 07/12/2011   Past Surgical History  Procedure Date  . Right ear/neck surgury 1997    reports that he has never smoked. He does not have any smokeless tobacco history on file. He reports that he drinks alcohol. He reports that he does not use illicit drugs. family history includes Cancer in his father. Allergies  Allergen Reactions  . Penicillins     REACTION: rash   No current outpatient prescriptions on file prior to visit.   Review of Systems Review of Systems  Constitutional: Negative for diaphoresis, activity change,  appetite change and unexpected weight change.  HENT: Negative for hearing loss, ear pain, facial swelling, mouth sores and neck stiffness.   Eyes: Negative for pain, redness and visual disturbance.  Respiratory: Negative for shortness of breath and wheezing.   Cardiovascular: Negative for chest pain and palpitations.  Gastrointestinal: Negative for diarrhea, blood in stool, abdominal distention and rectal pain.  Genitourinary: Negative for hematuria, flank pain and decreased urine volume.  Musculoskeletal: Negative for myalgias and joint swelling.  Skin: Negative for color change and wound.  Neurological: Negative for syncope and numbness.  Hematological: Negative for adenopathy.  Psychiatric/Behavioral: Negative for hallucinations, self-injury, decreased concentration and agitation.      Objective:   Physical Exam BP 100/66  Pulse 82  Temp(Src) 97.6 F (36.4 C) (Oral)  Ht 6' (1.829 m)  Wt 204 lb (92.534 kg)  BMI 27.67 kg/m2  SpO2 95% Physical Exam  VS noted Constitutional: Pt is oriented to person, place, and time. Appears well-developed and well-nourished.  HENT:  Head: Normocephalic and atraumatic.  Right Ear: External ear normal.  Left Ear: External ear normal.  Nose: Nose normal.  Mouth/Throat: Oropharynx is clear and moist.  Neck with fullness to right thyroid, somewhat firm, nonpulsatile, NT Eyes: Conjunctivae and EOM are normal. Pupils are equal, round, and reactive to light.  Neck: Normal range of motion. Neck supple. No JVD present. No tracheal deviation present.  Cardiovascular: Normal rate, regular rhythm, normal heart sounds and intact distal pulses.   Pulmonary/Chest: Effort normal and breath sounds normal.  Abdominal: Soft. Bowel sounds are normal.  There is no tenderness.  Musculoskeletal: Normal range of motion. Exhibits no edema.  Lymphadenopathy:  Has no cervical adenopathy.  Neurological: Pt is alert and oriented to person, place, and time. Pt has normal  reflexes. No cranial nerve deficit.  Skin: Skin is warm and dry. No rash noted.  Psychiatric:  Has  normal mood and affect. Behavior is normal.     Assessment & Plan:

## 2011-07-12 NOTE — Telephone Encounter (Signed)
Pharmacy requesting PA on omeprazole  Or alternative please advise

## 2011-07-12 NOTE — Telephone Encounter (Signed)
Done per emr 

## 2011-07-12 NOTE — Patient Instructions (Addendum)
Take all new medications as prescribed - the nasacort, fenofibrate (both sent to [facility]), and the Vit D 400 (OTC) Continue all other medications as before - the omeprazole (sent to [facility]) Please take 400 units per day Vit D Please return in 4 wks for labs only:  Lipids and liver tests (ordered in the computer) Please call the phone number [phone] (the PhoneTree System) for results of testing in 2-3 days;  When calling, simply dial the number, and when prompted enter the MRN number above (the Medical Record Number) and the # key, then the message should start. You will be contacted regarding the referral for: thyroid ultrasound Please continue low fat, low chol diet, regular exercise, and weight control Please return in 1 year for your yearly visit, or sooner if needed, with Lab testing done 3-5 days before

## 2011-07-12 NOTE — Telephone Encounter (Signed)
I suspect the PA is b/c the med is prescribed bid instead of qd  OK to try change to generic prevacid  1 qd - done per emr

## 2011-07-12 NOTE — Telephone Encounter (Signed)
Insurance rejected prevacid as well. MD approved switching back to Omeprazole 20 mg once daily and pharmacy will fill prescription.

## 2011-07-12 NOTE — Assessment & Plan Note (Signed)

## 2011-07-14 ENCOUNTER — Ambulatory Visit (HOSPITAL_COMMUNITY)
Admission: RE | Admit: 2011-07-14 | Discharge: 2011-07-14 | Disposition: A | Discharge status: Home / Self Care | Payer: 59 | Source: Ambulatory Visit | Attending: Internal Medicine | Admitting: Internal Medicine

## 2011-07-14 DIAGNOSIS — E041 Nontoxic single thyroid nodule: Secondary | ICD-10-CM | POA: Insufficient documentation

## 2011-07-16 ENCOUNTER — Encounter: Payer: Self-pay | Admitting: Internal Medicine

## 2011-07-16 NOTE — Assessment & Plan Note (Signed)
For vit d check,  to f/u any worsening symptoms or concerns

## 2011-07-16 NOTE — Assessment & Plan Note (Signed)
flonase intol, to change to nasacort asd,  to f/u any worsening symptoms or concerns

## 2011-07-16 NOTE — Assessment & Plan Note (Signed)
For low fat diet, for fenofibrate asd, f/u labs

## 2011-07-16 NOTE — Assessment & Plan Note (Signed)
Possible, for thyroid u/s

## 2011-07-16 NOTE — Assessment & Plan Note (Signed)
stable overall by hx and exam, , and pt to continue medical treatment as before   

## 2011-09-18 ENCOUNTER — Encounter: Payer: Self-pay | Admitting: Internal Medicine

## 2011-09-18 ENCOUNTER — Other Ambulatory Visit (INDEPENDENT_AMBULATORY_CARE_PROVIDER_SITE_OTHER): Payer: 59

## 2011-09-18 DIAGNOSIS — Z0000 Encounter for general adult medical examination without abnormal findings: Secondary | ICD-10-CM

## 2011-09-18 LAB — HEMOGLOBIN A1C: Hgb A1c MFr Bld: 5.6 % (ref 4.6–6.5)

## 2011-09-18 LAB — URINALYSIS, ROUTINE W REFLEX MICROSCOPIC
Bilirubin Urine: NEGATIVE
Hgb urine dipstick: NEGATIVE
Leukocytes, UA: NEGATIVE
Nitrite: NEGATIVE
Urobilinogen, UA: 0.2 (ref 0.0–1.0)
pH: 6 (ref 5.0–8.0)

## 2011-09-18 LAB — CBC WITH DIFFERENTIAL/PLATELET
Basophils Absolute: 0 10*3/uL (ref 0.0–0.1)
Eosinophils Absolute: 0.1 10*3/uL (ref 0.0–0.7)
HCT: 43 % (ref 39.0–52.0)
Lymphs Abs: 1.6 10*3/uL (ref 0.7–4.0)
MCHC: 33.8 g/dL (ref 30.0–36.0)
MCV: 88.1 fl (ref 78.0–100.0)
Monocytes Absolute: 0.3 10*3/uL (ref 0.1–1.0)
Neutro Abs: 2.4 10*3/uL (ref 1.4–7.7)
Platelets: 135 10*3/uL — ABNORMAL LOW (ref 150.0–400.0)
RDW: 12.5 % (ref 11.5–14.6)

## 2011-09-18 LAB — LIPID PANEL
HDL: 42.4 mg/dL (ref >39.00)
LDL Cholesterol: 96 mg/dL (ref 0–99)
Total CHOL/HDL Ratio: 4
Triglycerides: 173 mg/dL — ABNORMAL HIGH (ref 0.0–149.0)
VLDL: 34.6 mg/dL (ref 0.0–40.0)

## 2011-09-18 LAB — BASIC METABOLIC PANEL
CO2: 26 mEq/L (ref 19–32)
Chloride: 106 mEq/L (ref 96–112)
Glucose, Bld: 132 mg/dL — ABNORMAL HIGH (ref 70–99)
Potassium: 3.6 mEq/L (ref 3.5–5.1)
Sodium: 141 mEq/L (ref 135–145)

## 2011-09-18 LAB — TSH: TSH: 1.35 u[IU]/mL (ref 0.35–5.50)

## 2011-09-18 LAB — HEPATIC FUNCTION PANEL
AST: 24 U/L (ref 0–37)
Albumin: 4.3 g/dL (ref 3.5–5.2)

## 2011-09-18 LAB — PSA: PSA: 0.43 ng/mL (ref 0.10–4.00)

## 2011-09-19 LAB — VITAMIN D 25 HYDROXY (VIT D DEFICIENCY, FRACTURES): Vit D, 25-Hydroxy: 32 ng/mL (ref 30–89)

## 2011-12-06 ENCOUNTER — Other Ambulatory Visit: Payer: Self-pay

## 2011-12-06 MED ORDER — OMEPRAZOLE 20 MG PO CPDR: 20.0000 mg | DELAYED_RELEASE_CAPSULE | Freq: Every day | ORAL | Status: DC

## 2012-07-29 ENCOUNTER — Other Ambulatory Visit: Payer: Self-pay | Admitting: Internal Medicine

## 2012-08-26 ENCOUNTER — Other Ambulatory Visit (INDEPENDENT_AMBULATORY_CARE_PROVIDER_SITE_OTHER): Payer: 59

## 2012-08-26 ENCOUNTER — Telehealth: Payer: Self-pay

## 2012-08-26 DIAGNOSIS — Z0000 Encounter for general adult medical examination without abnormal findings: Secondary | ICD-10-CM

## 2012-08-26 LAB — URINALYSIS, ROUTINE W REFLEX MICROSCOPIC
Hgb urine dipstick: NEGATIVE
Leukocytes, UA: NEGATIVE
Specific Gravity, Urine: >=1.03 (ref 1.000–1.030)
Urobilinogen, UA: 0.2 (ref 0.0–1.0)

## 2012-08-26 LAB — CBC WITH DIFFERENTIAL/PLATELET
Basophils Relative: 0.3 % (ref 0.0–3.0)
Eosinophils Relative: 1.4 % (ref 0.0–5.0)
HCT: 44.5 % (ref 39.0–52.0)
Lymphs Abs: 1.4 10*3/uL (ref 0.7–4.0)
MCV: 88.8 fl (ref 78.0–100.0)
Monocytes Absolute: 0.3 10*3/uL (ref 0.1–1.0)
Neutro Abs: 2.9 10*3/uL (ref 1.4–7.7)
Platelets: 153 10*3/uL (ref 150.0–400.0)
RBC: 5.01 Mil/uL (ref 4.22–5.81)
WBC: 4.7 10*3/uL (ref 4.5–10.5)

## 2012-08-26 LAB — BASIC METABOLIC PANEL
BUN: 16 mg/dL (ref 6–23)
Calcium: 9.5 mg/dL (ref 8.4–10.5)
Creatinine, Ser: 1.2 mg/dL (ref 0.4–1.5)
GFR: 70.64 mL/min (ref >60.00)
Glucose, Bld: 84 mg/dL (ref 70–99)

## 2012-08-26 LAB — LIPID PANEL
Total CHOL/HDL Ratio: 4
VLDL: 20 mg/dL (ref 0.0–40.0)

## 2012-08-26 LAB — PSA: PSA: 0.64 ng/mL (ref 0.10–4.00)

## 2012-08-26 LAB — TSH: TSH: 0.95 u[IU]/mL (ref 0.35–5.50)

## 2012-08-26 LAB — HEPATIC FUNCTION PANEL: Albumin: 4.4 g/dL (ref 3.5–5.2)

## 2012-08-26 NOTE — Telephone Encounter (Signed)
CPX labs entered  

## 2012-08-28 ENCOUNTER — Ambulatory Visit (INDEPENDENT_AMBULATORY_CARE_PROVIDER_SITE_OTHER): Payer: 59 | Admitting: Internal Medicine

## 2012-08-28 ENCOUNTER — Encounter: Payer: Self-pay | Admitting: Internal Medicine

## 2012-08-28 VITALS — BP 110/82 | HR 70 | Temp 96.9°F | Ht 72.0 in | Wt 197.1 lb

## 2012-08-28 DIAGNOSIS — E079 Disorder of thyroid, unspecified: Secondary | ICD-10-CM

## 2012-08-28 DIAGNOSIS — Z0000 Encounter for general adult medical examination without abnormal findings: Secondary | ICD-10-CM

## 2012-08-28 DIAGNOSIS — J309 Allergic rhinitis, unspecified: Secondary | ICD-10-CM

## 2012-08-28 MED ORDER — FENOFIBRATE 160 MG PO TABS: ORAL_TABLET | ORAL | Status: DC

## 2012-08-28 MED ORDER — ASPIRIN 81 MG PO TBEC: 81.0000 mg | DELAYED_RELEASE_TABLET | Freq: Every day | ORAL | Status: DC

## 2012-08-28 MED ORDER — OMEPRAZOLE 20 MG PO CPDR: 20.0000 mg | DELAYED_RELEASE_CAPSULE | Freq: Every day | ORAL | Status: DC

## 2012-08-28 MED ORDER — TRIAMCINOLONE ACETONIDE(NASAL) 55 MCG/ACT NA INHA: 2.0000 | Freq: Every day | NASAL | Status: DC

## 2012-08-28 NOTE — Progress Notes (Signed)
Subjective:    Patient ID: Jonathan Mills, male    DOB: 08-23-1960, 52 y.o.   MRN: [id number]  HPI  Here for wellness and f/u;  Overall doing ok;  Pt denies CP, worsening SOB, DOE, wheezing, orthopnea, PND, worsening LE edema, palpitations, dizziness or syncope.  Pt denies neurological change such as new headache, facial or extremity weakness.  Pt denies polydipsia, polyuria, or low sugar symptoms. Pt states overall good compliance with treatment and medications, good tolerability, and has been trying to follow lower cholesterol diet.  Pt denies worsening depressive symptoms, suicidal ideation though had marked stressor since last wk as wife and he has agreed to separation, though planning on counseling as well.  No fever, night sweats, wt loss, loss of appetite, or other constitutional symptoms.  Pt states good ability with ADL's, has low fall risk, home safety reviewed and adequate, no other significant changes in hearing or vision, and only occasionally active with exercise. Due for f/u thryoid u/s with ? Vascular issue on last exam 2013, also due for f/u GI Past Medical History  Diagnosis Date  . Vitamin D deficiency 07/12/2011  . ALLERGIC RHINITIS 10/07/2008  . GERD 10/07/2008  . HYPERLIPIDEMIA 10/07/2008  . COLONIC POLYPS, HX OF 10/07/2008  . Hypertriglyceridemia 07/12/2011   Past Surgical History  Procedure Laterality Date  . Right ear/neck surgury  1997    reports that he has never smoked. He does not have any smokeless tobacco history on file. He reports that  drinks alcohol. He reports that he does not use illicit drugs. family history includes Cancer in his father. Allergies  Allergen Reactions  . Penicillins     REACTION: rash   No current outpatient prescriptions on file prior to visit.   No current facility-administered medications on file prior to visit.   Review of Systems Constitutional: Negative for diaphoresis, activity change, appetite change or unexpected weight change.   HENT: Negative for hearing loss, ear pain, facial swelling, mouth sores and neck stiffness.   Eyes: Negative for pain, redness and visual disturbance.  Respiratory: Negative for shortness of breath and wheezing.   Cardiovascular: Negative for chest pain and palpitations.  Gastrointestinal: Negative for diarrhea, blood in stool, abdominal distention or other pain Genitourinary: Negative for hematuria, flank pain or change in urine volume.  Musculoskeletal: Negative for myalgias and joint swelling.  Skin: Negative for color change and wound.  Neurological: Negative for syncope and numbness. other than noted Hematological: Negative for adenopathy.  Psychiatric/Behavioral: Negative for hallucinations, self-injury, decreased concentration and agitation.      Objective:   Physical Exam BP 110/82  Pulse 70  Temp(Src) 96.9 F (36.1 C) (Oral)  Ht 6' (1.829 m)  Wt 197 lb 2 oz (89.415 kg)  BMI 26.73 kg/m2  SpO2 97% VS noted,  Constitutional: Pt is oriented to person, place, and time. Appears well-developed and well-nourished.  Head: Normocephalic and atraumatic.  Right Ear: External ear normal.  Left Ear: External ear normal.  Nose: Nose normal.  Mouth/Throat: Oropharynx is clear and moist.  Eyes: Conjunctivae and EOM are normal. Pupils are equal, round, and reactive to light.  Neck: Normal range of motion. Neck supple. No JVD present. No tracheal deviation present.  Cardiovascular: Normal rate, regular rhythm, normal heart sounds and intact distal pulses.   Pulmonary/Chest: Effort normal and breath sounds normal.  Abdominal: Soft. Bowel sounds are normal. There is no tenderness. No HSM  Musculoskeletal: Normal range of motion. Exhibits no edema.  Lymphadenopathy:  Has  no cervical adenopathy.  Neurological: Pt is alert and oriented to person, place, and time. Pt has normal reflexes. No cranial nerve deficit.  Skin: Skin is warm and dry. No rash noted.  Psychiatric:  Has  normal mood and  affect. Behavior is normal. 1+ nervous    Assessment & Plan:

## 2012-08-28 NOTE — Assessment & Plan Note (Signed)

## 2012-08-28 NOTE — Patient Instructions (Addendum)
Please start the Aspirin at 81 mg - 1 per day - Coated Aspirin Please continue all other medications as before, and refills have been done if requested You will be contacted regarding the referral for: Dr Buccinni/GI for follow up colonoscopy You will be contacted regarding the referral for: thyroid ultrasoud Please continue your efforts at being more active, low cholesterol diet, and weight control. You are otherwise up to date with prevention measures today. Thank you for enrolling in MyChart. Please follow the instructions below to securely access your online medical record. MyChart allows you to send messages to your doctor, view your test results, renew your prescriptions, schedule appointments, and more. To Log into My Chart online, please go by [employer] or [employer] to [email or web address], or download the MyChart App from the [employer] .  Your Username is: [phone] (pass [email or web address]) Please send a practice Message on Mychart later today. Please return in 1 year for your yearly visit, or sooner if needed, with Lab testing done 3-5 days before

## 2012-09-02 ENCOUNTER — Ambulatory Visit
Admission: RE | Admit: 2012-09-02 | Discharge: 2012-09-02 | Disposition: A | Discharge status: Home / Self Care | Payer: 59 | Source: Ambulatory Visit | Attending: Internal Medicine | Admitting: Internal Medicine

## 2012-09-02 DIAGNOSIS — E079 Disorder of thyroid, unspecified: Secondary | ICD-10-CM

## 2012-10-03 ENCOUNTER — Ambulatory Visit (INDEPENDENT_AMBULATORY_CARE_PROVIDER_SITE_OTHER): Payer: 59 | Admitting: Internal Medicine

## 2012-10-03 ENCOUNTER — Encounter: Payer: Self-pay | Admitting: Internal Medicine

## 2012-10-03 VITALS — BP 104/70 | HR 78 | Temp 97.0°F | Ht 72.0 in | Wt 188.4 lb

## 2012-10-03 DIAGNOSIS — F411 Generalized anxiety disorder: Secondary | ICD-10-CM

## 2012-10-03 MED ORDER — DIAZEPAM 5 MG PO TABS: ORAL_TABLET | ORAL | Status: DC

## 2012-10-03 NOTE — Patient Instructions (Signed)
Please take all new medication as prescribed Please continue all other medications as before, and refills have been done if requested.  

## 2012-10-03 NOTE — Progress Notes (Signed)
Subjective:    Patient ID: Jonathan Mills, male    DOB: 13-Nov-1960, 52 y.o.   MRN: [id number]  HPI  Here to f/u, May be divorcing soon, seems to be on a roller coaster of anger, sadness, anxiety and all seems occasionally overwhelming, can get distracted at work, and says things to the wife in discussion that he impulsively says and wishes he had not done that, seems to be constant persistent, never ends, even at night with trying to go to sleep, wakes up at night.Ongoing now mld to mod, getting worse over 2 -32mo.  Recognizes he has some depressive symptoms, but has had major depreswsion in past and does not think things to that degree, has some feelings of hope, happiness off and on, just seems to be extremes of feeling up and down.  Has occas ETOH, slightly more lately, but still less than one beer per day.  No illicit drugs.  No SI or HI.  Tends to use words with anger, not physical.  Has had 3-4 SSRI in the past, celexa, lexapro and 2 others.  Seeing therapist regularly but not working with the roller coaster feelings.  Admits to taking a 5 mg valium per the wife's rx and did ok with half bid for approx 10 days, he is quite pleased with the results, has been "dramatic" in that he can concentrate, less overwhelmed and all over the place, intrusive thoughts dont consume him. Has an appt approx 3 wk with local psychiatry to help manage the valium Past Medical History  Diagnosis Date  . Vitamin D deficiency 07/12/2011  . ALLERGIC RHINITIS 10/07/2008  . GERD 10/07/2008  . HYPERLIPIDEMIA 10/07/2008  . COLONIC POLYPS, HX OF 10/07/2008  . Hypertriglyceridemia 07/12/2011   Past Surgical History  Procedure Laterality Date  . Right ear/neck surgury  1997    reports that he has never smoked. He does not have any smokeless tobacco history on file. He reports that  drinks alcohol. He reports that he does not use illicit drugs. family history includes Cancer in his father. Allergies  Allergen Reactions  .  Penicillins     REACTION: rash   Current Outpatient Prescriptions on File Prior to Visit  Medication Sig Dispense Refill  . fenofibrate 160 MG tablet TAKE 1 TABLET DAILY  90 tablet  3  . omeprazole (PRILOSEC) 20 MG capsule Take 1 capsule (20 mg total) by mouth daily.  90 capsule  3  . triamcinolone (NASACORT AQ) 55 MCG/ACT nasal inhaler Place 2 sprays into the nose daily.  3 Inhaler  3  . aspirin 81 MG EC tablet Take 1 tablet (81 mg total) by mouth daily. Swallow whole.  30 tablet  12   No current facility-administered medications on file prior to visit.   Review of Systems  Constitutional: Negative for unexpected weight change, or unusual diaphoresis  HENT: Negative for tinnitus.   Eyes: Negative for photophobia and visual disturbance.  Respiratory: Negative for choking and stridor.   Gastrointestinal: Negative for vomiting and blood in stool.  Genitourinary: Negative for hematuria and decreased urine volume.  Musculoskeletal: Negative for acute joint swelling Skin: Negative for color change and wound.  Neurological: Negative for tremors and numbness other than noted  Psychiatric/Behavioral: Negative for decreased concentration or  hyperactivity.       Objective:   Physical Exam BP 104/70  Pulse 78  Temp(Src) 97 F (36.1 C) (Oral)  Ht 6' (1.829 m)  Wt 188 lb 6 oz (85.446 kg)  BMI 25.54 kg/m2  SpO2 96% VS noted,  Constitutional: Pt appears well-developed and well-nourished.  HENT: Head: NCAT.  Right Ear: External ear normal.  Left Ear: External ear normal.  Eyes: Conjunctivae and EOM are normal. Pupils are equal, round, and reactive to light.  Neck: Normal range of motion. Neck supple.  Cardiovascular: Normal rate and regular rhythm.   Pulmonary/Chest: Effort normal and breath sounds normal.  Neurological: Pt is alert. Not confused  Skin: Skin is warm. No erythema.  Psychiatric: Pt behavior is normal. Thought content normal. 1-2+ nervous    Assessment & Plan:

## 2012-10-03 NOTE — Assessment & Plan Note (Signed)
Ok for valium 5 - half bid prn,  to f/u any worsening symptoms or concerns, cont counseling

## 2013-03-20 ENCOUNTER — Other Ambulatory Visit: Payer: Self-pay

## 2013-08-19 ENCOUNTER — Other Ambulatory Visit: Payer: Self-pay

## 2013-08-19 MED ORDER — OMEPRAZOLE 20 MG PO CPDR: 20.0000 mg | DELAYED_RELEASE_CAPSULE | Freq: Every day | ORAL | Status: DC

## 2013-09-25 ENCOUNTER — Encounter: Payer: Self-pay | Admitting: Physician Assistant

## 2013-09-25 ENCOUNTER — Ambulatory Visit (INDEPENDENT_AMBULATORY_CARE_PROVIDER_SITE_OTHER): Payer: 59 | Admitting: Physician Assistant

## 2013-09-25 VITALS — BP 94/68 | HR 76 | Temp 98.0°F | Resp 18 | Wt 182.0 lb

## 2013-09-25 DIAGNOSIS — J020 Streptococcal pharyngitis: Secondary | ICD-10-CM

## 2013-09-25 DIAGNOSIS — J029 Acute pharyngitis, unspecified: Secondary | ICD-10-CM

## 2013-09-25 LAB — POCT RAPID STREP A (OFFICE): RAPID STREP A SCREEN: POSITIVE — AB

## 2013-09-25 MED ORDER — CEPHALEXIN 500 MG PO CAPS: 500.0000 mg | ORAL_CAPSULE | Freq: Two times a day (BID) | ORAL | Status: DC

## 2013-09-25 NOTE — Progress Notes (Signed)
Pre visit review using our clinic review tool, if applicable. No additional management support is needed unless otherwise documented below in the visit note. 

## 2013-09-25 NOTE — Progress Notes (Signed)
Subjective:    Patient ID: Jonathan Mills, male    DOB: 06-Mar-1961, 53 y.o.   MRN: [id number]  Sore Throat  This is a new problem. The current episode started in the past 7 days. The problem has been gradually worsening. The pain is worse on the left side. The maximum temperature recorded prior to his arrival was 100 - 100.9 F. The fever has been present for 3 to 4 days. The pain is mild. Associated symptoms include headaches, swollen glands and vomiting (1 episode.). Pertinent negatives include no abdominal pain, congestion, coughing, diarrhea, drooling, ear discharge, ear pain, hoarse voice, plugged ear sensation, neck pain, shortness of breath, stridor or trouble swallowing. He has had no exposure to strep or mono. He has tried acetaminophen for the symptoms. The treatment provided moderate (reduced fever.) relief.      Review of Systems  Constitutional: Positive for fever, chills, appetite change (has actually improved last 2 days.) and fatigue.  HENT: Negative for congestion, drooling, ear discharge, ear pain, hoarse voice and trouble swallowing.   Respiratory: Negative for cough, choking, shortness of breath and stridor.   Cardiovascular: Negative for chest pain.  Gastrointestinal: Positive for nausea and vomiting (1 episode.). Negative for abdominal pain and diarrhea.  Musculoskeletal: Negative for neck pain.  Neurological: Positive for headaches.  All other systems reviewed and are negative.   Past Medical History  Diagnosis Date  . Vitamin D deficiency 07/12/2011  . ALLERGIC RHINITIS 10/07/2008  . GERD 10/07/2008  . HYPERLIPIDEMIA 10/07/2008  . COLONIC POLYPS, HX OF 10/07/2008  . Hypertriglyceridemia 07/12/2011   Past Surgical History  Procedure Laterality Date  . Right [place]    reports that he has never smoked. He does not have any smokeless tobacco history on file. He reports that he drinks alcohol. He reports that he does not use illicit drugs. family history  includes Cancer in his father. Allergies  Allergen Reactions  . Penicillins     REACTION: rash       Objective:   Physical Exam  Nursing note and vitals reviewed. Constitutional: He is oriented to person, place, and time. He appears well-developed and well-nourished. No distress.  HENT:  Head: Normocephalic and atraumatic.  Right Ear: External ear normal.  Left Ear: External ear normal.  Nose: Nose normal.  Mouth/Throat: Oropharyngeal exudate present.  Oropharyngeal erythema with exudate.  Bilateral tympanic membranes appear normal  Bilateral frontal maxillary sinuses nontender to palpation  Eyes: Conjunctivae and EOM are normal. Pupils are equal, round, and reactive to light.  Neck: Normal range of motion. Neck supple. No JVD present.  Cardiovascular: Normal rate, regular rhythm, normal heart sounds and intact distal pulses.  Exam reveals no gallop and no friction rub.   No murmur heard. Pulmonary/Chest: Effort normal and breath sounds normal. No stridor. No respiratory distress. He has no wheezes. He has no rales. He exhibits no tenderness.  Musculoskeletal: Normal range of motion.  Lymphadenopathy:    He has no cervical adenopathy.  Neurological: He is alert and oriented to person, place, and time.  Skin: Skin is warm and dry. No rash noted. He is not diaphoretic. No erythema. No pallor.  Psychiatric: He has a normal mood and affect. His behavior is normal. Judgment and thought content normal.   Filed Vitals:   09/25/13 1125  BP: 94/68  Pulse: 76  Temp: 98 F (36.7 C)  Resp: 18   Lab Results  Component Value Date   WBC 4.7 08/26/2012  HGB 14.9 08/26/2012   HCT 44.5 08/26/2012   PLT 153.0 08/26/2012   GLUCOSE 84 08/26/2012   CHOL 156 08/26/2012   TRIG 100.0 08/26/2012   HDL 41.40 08/26/2012   LDLDIRECT 47.8 07/06/2011   LDLCALC 95 08/26/2012   ALT 36 08/26/2012   AST 21 08/26/2012   NA 142 08/26/2012   K 4.2 08/26/2012   CL 107 08/26/2012   CREATININE 1.2 08/26/2012    BUN 16 08/26/2012   CO2 29 08/26/2012   TSH 0.95 08/26/2012   PSA 0.64 08/26/2012   HGBA1C 5.6 09/18/2011       Assessment & Plan:  Jonathan Mills was seen today for sore throat.  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  Plan to follow up in one week to reassess.  Patient Instructions  You have Strep Pharyngitis. We will treat with Cephalexin twice a day for 10 days.  Throat lozenges or throat spray can help decrease sore throat.  Ibuprofen can help with body aches, fever can be helped by Tylenol.  Followup to clinic in one week to reassess or sooner if symptoms worsen or fail to improve despite treatment.

## 2013-09-25 NOTE — Patient Instructions (Addendum)
You have Strep Pharyngitis. We will treat with Cephalexin twice a day for 10 days.  Throat lozenges or throat spray can help decrease sore throat.  Ibuprofen can help with body aches, fever can be helped by Tylenol.  Followup to clinic in one week to reassess or sooner if symptoms worsen or fail to improve despite treatment.  Strep Throat Strep throat is an infection of the throat. It is caused by a germ. Strep throat spreads from person to person by coughing, sneezing, or close contact. HOME CARE  Rinse your mouth (gargle) with warm salt water (1 teaspoon salt in 1 cup of water). Do this 3 to 4 times per day or as needed for comfort.  Family members with a sore throat or fever should see a doctor.  Make sure everyone in your house washes their hands well.  Do not share food, drinking cups, or personal items.  Eat soft foods until your sore throat gets better.  Drink enough water and fluids to keep your pee (urine) clear or pale yellow.  Rest.  Stay home from school, daycare, or work until you have taken medicine for 24 hours.  Only take medicine as told by your doctor.  Take your medicine as told. Finish it even if you start to feel better. GET HELP RIGHT AWAY IF:   You have new problems, such as throwing up (vomiting) or bad headaches.  You have a stiff or painful neck, chest pain, trouble breathing, or trouble swallowing.  You have very bad throat pain, drooling, or changes in your voice.  Your neck puffs up (swells) or gets red and tender.  You have a fever.  You are very tired, your mouth is dry, or you are peeing less than normal.  You cannot wake up completely.  You get a rash, cough, or earache.  You have green, yellow-brown, or bloody spit.  Your pain does not get better with medicine. MAKE SURE YOU:   Understand these instructions.  Will watch your condition.  Will get help right away if you are not doing well or get worse. Document Released:  10/18/2007 Document Revised: 07/24/2011 Document Reviewed: 06/30/2010 [facility] Patient Information 2014 [place].

## 2013-10-02 ENCOUNTER — Ambulatory Visit: Payer: 59 | Admitting: Physician Assistant

## 2013-10-13 ENCOUNTER — Other Ambulatory Visit (INDEPENDENT_AMBULATORY_CARE_PROVIDER_SITE_OTHER): Payer: 59

## 2013-10-13 ENCOUNTER — Telehealth: Payer: Self-pay

## 2013-10-13 DIAGNOSIS — Z0000 Encounter for general adult medical examination without abnormal findings: Secondary | ICD-10-CM

## 2013-10-13 LAB — LIPID PANEL
CHOL/HDL RATIO: 4
Cholesterol: 175 mg/dL (ref 0–200)
HDL: 47.6 mg/dL (ref >39.00)
LDL CALC: 108 mg/dL — AB (ref 0–99)
TRIGLYCERIDES: 97 mg/dL (ref 0.0–149.0)
VLDL: 19.4 mg/dL (ref 0.0–40.0)

## 2013-10-13 LAB — URINALYSIS, ROUTINE W REFLEX MICROSCOPIC
BILIRUBIN URINE: NEGATIVE
Hgb urine dipstick: NEGATIVE
Ketones, ur: NEGATIVE
LEUKOCYTES UA: NEGATIVE
Nitrite: NEGATIVE
PH: 6 (ref 5.0–8.0)
SPECIFIC GRAVITY, URINE: 1.025 (ref 1.000–1.030)
TOTAL PROTEIN, URINE-UPE24: NEGATIVE
Urine Glucose: NEGATIVE
Urobilinogen, UA: 0.2 (ref 0.0–1.0)

## 2013-10-13 LAB — BASIC METABOLIC PANEL
BUN: 15 mg/dL (ref 6–23)
CO2: 29 meq/L (ref 19–32)
CREATININE: 1 mg/dL (ref 0.4–1.5)
Calcium: 9.5 mg/dL (ref 8.4–10.5)
Chloride: 107 mEq/L (ref 96–112)
GFR: 80.67 mL/min (ref >60.00)
GLUCOSE: 81 mg/dL (ref 70–99)
Potassium: 4.3 mEq/L (ref 3.5–5.1)
Sodium: 142 mEq/L (ref 135–145)

## 2013-10-13 LAB — HEPATIC FUNCTION PANEL
ALBUMIN: 4.2 g/dL (ref 3.5–5.2)
ALK PHOS: 47 U/L (ref 39–117)
ALT: 21 U/L (ref 0–53)
AST: 20 U/L (ref 0–37)
Bilirubin, Direct: 0.1 mg/dL (ref 0.0–0.3)
Total Bilirubin: 0.5 mg/dL (ref 0.2–1.2)
Total Protein: 7.1 g/dL (ref 6.0–8.3)

## 2013-10-13 LAB — CBC WITH DIFFERENTIAL/PLATELET
BASOS ABS: 0 10*3/uL (ref 0.0–0.1)
Basophils Relative: 0.4 % (ref 0.0–3.0)
Eosinophils Absolute: 0.1 10*3/uL (ref 0.0–0.7)
Eosinophils Relative: 2.4 % (ref 0.0–5.0)
HCT: 40.9 % (ref 39.0–52.0)
HEMOGLOBIN: 13.6 g/dL (ref 13.0–17.0)
LYMPHS PCT: 45.4 % (ref 12.0–46.0)
Lymphs Abs: 2.3 10*3/uL (ref 0.7–4.0)
MCHC: 33.3 g/dL (ref 30.0–36.0)
MCV: 88.1 fl (ref 78.0–100.0)
MONOS PCT: 7.6 % (ref 3.0–12.0)
Monocytes Absolute: 0.4 10*3/uL (ref 0.1–1.0)
NEUTROS ABS: 2.2 10*3/uL (ref 1.4–7.7)
NEUTROS PCT: 44.2 % (ref 43.0–77.0)
Platelets: 156 10*3/uL (ref 150.0–400.0)
RBC: 4.65 Mil/uL (ref 4.22–5.81)
RDW: 12.4 % (ref 11.5–15.5)
WBC: 5 10*3/uL (ref 4.0–10.5)

## 2013-10-13 LAB — TSH: TSH: 1.53 u[IU]/mL (ref 0.35–4.50)

## 2013-10-13 LAB — PSA: PSA: 0.66 ng/mL (ref 0.10–4.00)

## 2013-10-13 NOTE — Telephone Encounter (Signed)
Phone call from [facility] lab stating patient is there for blood work but his orders have expired and need to be placed

## 2013-10-17 ENCOUNTER — Ambulatory Visit (INDEPENDENT_AMBULATORY_CARE_PROVIDER_SITE_OTHER): Payer: 59 | Admitting: Internal Medicine

## 2013-10-17 ENCOUNTER — Encounter: Payer: Self-pay | Admitting: Internal Medicine

## 2013-10-17 VITALS — BP 112/78 | HR 69 | Temp 98.2°F | Ht 72.0 in | Wt 187.0 lb

## 2013-10-17 DIAGNOSIS — Z86010 Personal history of colonic polyps: Secondary | ICD-10-CM

## 2013-10-17 DIAGNOSIS — J309 Allergic rhinitis, unspecified: Secondary | ICD-10-CM

## 2013-10-17 DIAGNOSIS — E041 Nontoxic single thyroid nodule: Secondary | ICD-10-CM

## 2013-10-17 DIAGNOSIS — Z0000 Encounter for general adult medical examination without abnormal findings: Secondary | ICD-10-CM

## 2013-10-17 DIAGNOSIS — F411 Generalized anxiety disorder: Secondary | ICD-10-CM

## 2013-10-17 MED ORDER — FENOFIBRATE 160 MG PO TABS: ORAL_TABLET | ORAL | Status: DC

## 2013-10-17 MED ORDER — TRIAMCINOLONE ACETONIDE 55 MCG/ACT NA AERO: 2.0000 | INHALATION_SPRAY | Freq: Every day | NASAL | Status: DC

## 2013-10-17 NOTE — Assessment & Plan Note (Signed)
For fu u/s

## 2013-10-17 NOTE — Progress Notes (Signed)
Pre visit review using our clinic review tool, if applicable. No additional management support is needed unless otherwise documented below in the visit note. 

## 2013-10-17 NOTE — Assessment & Plan Note (Signed)
Pt will call GI himself for f/u colonosocpy

## 2013-10-17 NOTE — Assessment & Plan Note (Signed)
Declines tx at this time

## 2013-10-17 NOTE — Assessment & Plan Note (Signed)

## 2013-10-17 NOTE — Patient Instructions (Signed)
Please continue all other medications as before, and refills have been done if requested.  Please have the pharmacy call with any other refills you may need.  Please continue your efforts at being more active, low cholesterol diet, and weight control.  You are otherwise up to date with prevention measures today.  Please keep your appointments with your specialists as you may have planned  You will be contacted regarding the referral for: thyroid ultrasound follow up  Please call GI - Dr Kalman Shan - for the follow up colonoscopy  Please return in 1 year for your yearly visit, or sooner if needed, with Lab testing done 3-5 days before

## 2013-10-17 NOTE — Progress Notes (Signed)
Subjective:    Patient ID: Jonathan Mills, male    DOB: 01-11-1961, 53 y.o.   MRN: [id number]  HPI  Here for wellness and f/u;  Overall doing ok;  Pt denies CP, worsening SOB, DOE, wheezing, orthopnea, PND, worsening LE edema, palpitations, dizziness or syncope.  Pt denies neurological change such as new headache, facial or extremity weakness.  Pt denies polydipsia, polyuria, or low sugar symptoms. Pt states overall good compliance with treatment and medications, good tolerability, and has been trying to follow lower cholesterol diet.  Pt denies worsening depressive symptoms, suicidal ideation or panic. No fever, night sweats, wt loss, loss of appetite, or other constitutional symptoms.  Pt states good ability with ADL's, has low fall risk, home safety reviewed and adequate, no other significant changes in hearing or vision, and only occasionally active with exercise. Plans to see dr Buccini/GI later this yr for f/u colonoscopy (hx of adenom polyp). Pt continues to have recurring LBP without change in severity, bowel or bladder change, fever, wt loss,  worsening LE pain/numbness/weakness, gait change or falls.  Still sees chiropracter for occas tx.  Currently going through seperation, has 25 yo child, increased stress, 2 mild panic like attacks in past 2 mo, declines tx for now.  Has concenrs about forgetfullness, has FH of dementia Past Medical History  Diagnosis Date  . Vitamin D deficiency 07/12/2011  . ALLERGIC RHINITIS 10/07/2008  . GERD 10/07/2008  . HYPERLIPIDEMIA 10/07/2008  . COLONIC POLYPS, HX OF 10/07/2008  . Hypertriglyceridemia 07/12/2011   Past Surgical History  Procedure Laterality Date  . Right [place]    reports that he has never smoked. He does not have any smokeless tobacco history on file. He reports that he drinks alcohol. He reports that he does not use illicit drugs. family history includes Cancer in his father. Allergies  Allergen Reactions  . Penicillins    REACTION: rash   Current Outpatient Prescriptions on File Prior to Visit  Medication Sig Dispense Refill  . omeprazole (PRILOSEC) 20 MG capsule Take 1 capsule (20 mg total) by mouth daily.  90 capsule  3   No current facility-administered medications on file prior to visit.    Review of Systems Constitutional: Negative for increased diaphoresis, other activity, appetite or other siginficant weight change  HENT: Negative for worsening hearing loss, ear pain, facial swelling, mouth sores and neck stiffness.   Eyes: Negative for other worsening pain, redness or visual disturbance.  Respiratory: Negative for shortness of breath and wheezing.   Cardiovascular: Negative for chest pain and palpitations.  Gastrointestinal: Negative for diarrhea, blood in stool, abdominal distention or other pain Genitourinary: Negative for hematuria, flank pain or change in urine volume.  Musculoskeletal: Negative for myalgias or other joint complaints.  Skin: Negative for color change and wound.  Neurological: Negative for syncope and numbness. other than noted Hematological: Negative for adenopathy. or other swelling Psychiatric/Behavioral: Negative for hallucinations, self-injury, decreased concentration or other worsening agitation.      Objective:   Physical Exam BP 112/78  Pulse 69  Temp(Src) 98.2 F (36.8 C) (Oral)  Ht 6' (1.829 m)  Wt 187 lb (84.823 kg)  BMI 25.36 kg/m2  SpO2 96% VS noted,  Constitutional: Pt is oriented to person, place, and time. Appears well-developed and well-nourished.  Head: Normocephalic and atraumatic.  Right Ear: External ear normal.  Left Ear: External ear normal.  Nose: Nose normal.  Mouth/Throat: Oropharynx is clear and moist.  Eyes: Conjunctivae and  EOM are normal. Pupils are equal, round, and reactive to light.  Neck: Normal range of motion. Neck supple. No JVD present. No tracheal deviation present.  Cardiovascular: Normal rate, regular rhythm, normal heart  sounds and intact distal pulses.   Pulmonary/Chest: Effort normal and breath sounds without rales or wheezing  Abdominal: Soft. Bowel sounds are normal. NT. No HSM  Musculoskeletal: Normal range of motion. Exhibits no edema.  Lymphadenopathy:  Has no cervical adenopathy.  Neurological: Pt is alert and oriented to person, place, and time. Pt has normal reflexes. No cranial nerve deficit. Motor grossly intact Skin: Skin is warm and dry. No rash noted.  Psychiatric:  Has mild nervous mood and affect. Behavior is normal.     Assessment & Plan:

## 2013-10-30 ENCOUNTER — Other Ambulatory Visit: Payer: 59

## 2013-10-31 ENCOUNTER — Ambulatory Visit
Admission: RE | Admit: 2013-10-31 | Discharge: 2013-10-31 | Disposition: A | Discharge status: Home / Self Care | Payer: 59 | Source: Ambulatory Visit | Attending: Internal Medicine | Admitting: Internal Medicine

## 2013-10-31 DIAGNOSIS — E041 Nontoxic single thyroid nodule: Secondary | ICD-10-CM

## 2013-12-10 ENCOUNTER — Encounter (HOSPITAL_COMMUNITY): Payer: Self-pay | Admitting: Pharmacy Technician

## 2013-12-15 ENCOUNTER — Encounter (HOSPITAL_COMMUNITY)
Admission: RE | Admit: 2013-12-15 | Discharge: 2013-12-15 | Disposition: A | Discharge status: Home / Self Care | Payer: 59 | Source: Ambulatory Visit | Attending: Orthopedic Surgery | Admitting: Orthopedic Surgery

## 2013-12-15 ENCOUNTER — Encounter (HOSPITAL_COMMUNITY): Payer: Self-pay

## 2013-12-15 DIAGNOSIS — E781 Pure hyperglyceridemia: Secondary | ICD-10-CM | POA: Diagnosis not present

## 2013-12-15 DIAGNOSIS — M19019 Primary osteoarthritis, unspecified shoulder: Secondary | ICD-10-CM | POA: Diagnosis not present

## 2013-12-15 DIAGNOSIS — S43429A Sprain of unspecified rotator cuff capsule, initial encounter: Secondary | ICD-10-CM | POA: Diagnosis present

## 2013-12-15 DIAGNOSIS — Z86010 Personal history of colonic polyps: Secondary | ICD-10-CM | POA: Diagnosis not present

## 2013-12-15 DIAGNOSIS — Z79899 Other long term (current) drug therapy: Secondary | ICD-10-CM | POA: Diagnosis not present

## 2013-12-15 DIAGNOSIS — E785 Hyperlipidemia, unspecified: Secondary | ICD-10-CM | POA: Diagnosis not present

## 2013-12-15 DIAGNOSIS — K219 Gastro-esophageal reflux disease without esophagitis: Secondary | ICD-10-CM | POA: Diagnosis not present

## 2013-12-15 DIAGNOSIS — M25819 Other specified joint disorders, unspecified shoulder: Secondary | ICD-10-CM | POA: Diagnosis not present

## 2013-12-15 DIAGNOSIS — Z880 Allergy status to penicillin: Secondary | ICD-10-CM | POA: Diagnosis not present

## 2013-12-15 DIAGNOSIS — E559 Vitamin D deficiency, unspecified: Secondary | ICD-10-CM | POA: Diagnosis not present

## 2013-12-15 HISTORY — DX: Headache: R51

## 2013-12-15 HISTORY — DX: Emotional lability: R45.86

## 2013-12-15 LAB — COMPREHENSIVE METABOLIC PANEL
ALT: 20 U/L (ref 0–53)
AST: 19 U/L (ref 0–37)
Albumin: 4.3 g/dL (ref 3.5–5.2)
Alkaline Phosphatase: 54 U/L (ref 39–117)
Anion gap: 13 (ref 5–15)
BUN: 17 mg/dL (ref 6–23)
CALCIUM: 9.1 mg/dL (ref 8.4–10.5)
CO2: 24 mEq/L (ref 19–32)
Chloride: 104 mEq/L (ref 96–112)
Creatinine, Ser: 0.98 mg/dL (ref 0.50–1.35)
GFR calc non Af Amer: >90 mL/min (ref >90)
GLUCOSE: 79 mg/dL (ref 70–99)
POTASSIUM: 4.3 meq/L (ref 3.7–5.3)
Sodium: 141 mEq/L (ref 137–147)
TOTAL PROTEIN: 7.5 g/dL (ref 6.0–8.3)
Total Bilirubin: 0.3 mg/dL (ref 0.3–1.2)

## 2013-12-15 LAB — CBC WITH DIFFERENTIAL/PLATELET
BASOS ABS: 0 10*3/uL (ref 0.0–0.1)
Basophils Relative: 0 % (ref 0–1)
EOS ABS: 0.1 10*3/uL (ref 0.0–0.7)
EOS PCT: 2 % (ref 0–5)
HCT: 42.1 % (ref 39.0–52.0)
Hemoglobin: 14.2 g/dL (ref 13.0–17.0)
Lymphocytes Relative: 38 % (ref 12–46)
Lymphs Abs: 2.3 10*3/uL (ref 0.7–4.0)
MCH: 29.2 pg (ref 26.0–34.0)
MCHC: 33.7 g/dL (ref 30.0–36.0)
MCV: 86.6 fL (ref 78.0–100.0)
Monocytes Absolute: 0.5 10*3/uL (ref 0.1–1.0)
Monocytes Relative: 7 % (ref 3–12)
NEUTROS PCT: 53 % (ref 43–77)
Neutro Abs: 3.3 10*3/uL (ref 1.7–7.7)
PLATELETS: 151 10*3/uL (ref 150–400)
RBC: 4.86 MIL/uL (ref 4.22–5.81)
RDW: 12.3 % (ref 11.5–15.5)
WBC: 6.2 10*3/uL (ref 4.0–10.5)

## 2013-12-15 LAB — APTT: aPTT: 27 seconds (ref 24–37)

## 2013-12-15 LAB — PROTIME-INR
INR: 1.12 (ref 0.00–1.49)
Prothrombin Time: 14.4 seconds (ref 11.6–15.2)

## 2013-12-15 NOTE — Pre-Procedure Instructions (Signed)
Jonathan Mills  12/15/2013   Your procedure is scheduled on:  Thursday, Aug. 6th   Report to [facility] Admitting at 8:00 AM.  Call this number if you have problems the morning of surgery: [phone]   Remember:   Do not eat food or drink liquids after midnight Wednesday.   Take these medicines the morning of surgery with A SIP OF WATER: Omeprazole, Hydrocodone, Lamictal   Do not wear jewelry, rings or watches.  Do not wear lotions or colognes. You may NOT wear deodorant.   Men may shave face and neck.   Do not bring valuables to the hospital.  [facility] is not responsible for any belongings or valuables.               Contacts, dentures or bridgework may not be worn into surgery.  Leave suitcase in the car. After surgery it may be brought to your room.  For patients admitted to the hospital, discharge time is determined by your treatment team.                Name and phone number of your driver:    Special Instructions: "Preparing for Surgery" instruction sheet.   Please read over the following fact sheets that you were given: Pain Booklet, Coughing and Deep Breathing, Blood Transfusion Information and Surgical Site Infection Prevention

## 2013-12-17 MED ORDER — CHLORHEXIDINE GLUCONATE 4 % EX LIQD
60.0000 mL | Freq: Once | CUTANEOUS | Status: DC
  Filled 2013-12-17: qty 60

## 2013-12-17 MED ORDER — LACTATED RINGERS IV SOLN
INTRAVENOUS | Status: DC
  Administered 2013-12-18 (×2): via INTRAVENOUS

## 2013-12-17 MED ORDER — CEFAZOLIN SODIUM-DEXTROSE 2-3 GM-% IV SOLR
2.0000 g | INTRAVENOUS | Status: AC
  Administered 2013-12-18: 2 g via INTRAVENOUS
  Filled 2013-12-17: qty 50

## 2013-12-18 ENCOUNTER — Ambulatory Visit (HOSPITAL_COMMUNITY): Payer: 59 | Admitting: Certified Registered Nurse Anesthetist

## 2013-12-18 ENCOUNTER — Encounter (HOSPITAL_COMMUNITY): Payer: 59 | Admitting: Certified Registered Nurse Anesthetist

## 2013-12-18 ENCOUNTER — Encounter (HOSPITAL_COMMUNITY): Payer: Self-pay | Admitting: *Deleted

## 2013-12-18 ENCOUNTER — Encounter (HOSPITAL_COMMUNITY)
Admission: RE | Disposition: A | Discharge status: Home / Self Care | Payer: Self-pay | Source: Ambulatory Visit | Attending: Orthopedic Surgery

## 2013-12-18 ENCOUNTER — Ambulatory Visit (HOSPITAL_COMMUNITY)
Admission: RE | Admit: 2013-12-18 | Discharge: 2013-12-18 | Disposition: A | Discharge status: Home / Self Care | Payer: 59 | Source: Ambulatory Visit | Attending: Orthopedic Surgery | Admitting: Orthopedic Surgery

## 2013-12-18 DIAGNOSIS — E559 Vitamin D deficiency, unspecified: Secondary | ICD-10-CM | POA: Insufficient documentation

## 2013-12-18 DIAGNOSIS — M19019 Primary osteoarthritis, unspecified shoulder: Secondary | ICD-10-CM | POA: Insufficient documentation

## 2013-12-18 DIAGNOSIS — M7580 Other shoulder lesions, unspecified shoulder: Secondary | ICD-10-CM

## 2013-12-18 DIAGNOSIS — S43429A Sprain of unspecified rotator cuff capsule, initial encounter: Secondary | ICD-10-CM | POA: Insufficient documentation

## 2013-12-18 DIAGNOSIS — K219 Gastro-esophageal reflux disease without esophagitis: Secondary | ICD-10-CM | POA: Insufficient documentation

## 2013-12-18 DIAGNOSIS — Z79899 Other long term (current) drug therapy: Secondary | ICD-10-CM | POA: Insufficient documentation

## 2013-12-18 DIAGNOSIS — Z880 Allergy status to penicillin: Secondary | ICD-10-CM | POA: Insufficient documentation

## 2013-12-18 DIAGNOSIS — M25819 Other specified joint disorders, unspecified shoulder: Secondary | ICD-10-CM | POA: Insufficient documentation

## 2013-12-18 DIAGNOSIS — Z860100 Personal history of colon polyps, unspecified: Secondary | ICD-10-CM | POA: Insufficient documentation

## 2013-12-18 DIAGNOSIS — E781 Pure hyperglyceridemia: Secondary | ICD-10-CM | POA: Insufficient documentation

## 2013-12-18 DIAGNOSIS — E785 Hyperlipidemia, unspecified: Secondary | ICD-10-CM | POA: Insufficient documentation

## 2013-12-18 HISTORY — PX: SHOULDER ARTHROSCOPY WITH SUBACROMIAL DECOMPRESSION, ROTATOR CUFF REPAIR AND BICEP TENDON REPAIR: SHX5687

## 2013-12-18 SURGERY — SHOULDER ARTHROSCOPY WITH SUBACROMIAL DECOMPRESSION, ROTATOR CUFF REPAIR AND BICEP TENDON REPAIR
Anesthesia: General | Site: Shoulder | Laterality: Left

## 2013-12-18 MED ORDER — OXYCODONE HCL 5 MG/5ML PO SOLN: 5.0000 mg | Freq: Once | ORAL | Status: AC | PRN

## 2013-12-18 MED ORDER — MIDAZOLAM HCL 2 MG/2ML IJ SOLN: 0.5000 mg | Freq: Once | INTRAMUSCULAR | Status: DC | PRN

## 2013-12-18 MED ORDER — ARTIFICIAL TEARS OP OINT
TOPICAL_OINTMENT | OPHTHALMIC | Status: AC
  Filled 2013-12-18: qty 3.5

## 2013-12-18 MED ORDER — STERILE WATER FOR INJECTION IJ SOLN
INTRAMUSCULAR | Status: AC
  Filled 2013-12-18: qty 10

## 2013-12-18 MED ORDER — ROCURONIUM BROMIDE 100 MG/10ML IV SOLN
INTRAVENOUS | Status: DC | PRN
  Administered 2013-12-18: 35 mg via INTRAVENOUS

## 2013-12-18 MED ORDER — DIAZEPAM 5 MG PO TABS: 2.5000 mg | ORAL_TABLET | Freq: Four times a day (QID) | ORAL | Status: DC | PRN

## 2013-12-18 MED ORDER — MEPERIDINE HCL 25 MG/ML IJ SOLN: 6.2500 mg | INTRAMUSCULAR | Status: DC | PRN

## 2013-12-18 MED ORDER — PROPOFOL 10 MG/ML IV BOLUS
INTRAVENOUS | Status: AC
  Filled 2013-12-18: qty 20

## 2013-12-18 MED ORDER — ROCURONIUM BROMIDE 50 MG/5ML IV SOLN
INTRAVENOUS | Status: AC
  Filled 2013-12-18: qty 1

## 2013-12-18 MED ORDER — DIPHENHYDRAMINE HCL 50 MG/ML IJ SOLN
INTRAMUSCULAR | Status: AC
  Filled 2013-12-18: qty 1

## 2013-12-18 MED ORDER — MIDAZOLAM HCL 2 MG/2ML IJ SOLN
INTRAMUSCULAR | Status: AC
  Administered 2013-12-18: 2 mg
  Filled 2013-12-18: qty 2

## 2013-12-18 MED ORDER — FENTANYL CITRATE 0.05 MG/ML IJ SOLN
INTRAMUSCULAR | Status: DC | PRN
  Administered 2013-12-18 (×2): 50 ug via INTRAVENOUS
  Administered 2013-12-18: 100 ug via INTRAVENOUS
  Administered 2013-12-18 (×2): 50 ug via INTRAVENOUS

## 2013-12-18 MED ORDER — EPHEDRINE SULFATE 50 MG/ML IJ SOLN
INTRAMUSCULAR | Status: AC
  Filled 2013-12-18: qty 1

## 2013-12-18 MED ORDER — LIDOCAINE HCL (CARDIAC) 20 MG/ML IV SOLN
INTRAVENOUS | Status: AC
  Filled 2013-12-18: qty 5

## 2013-12-18 MED ORDER — PROPOFOL 10 MG/ML IV BOLUS
INTRAVENOUS | Status: DC | PRN
  Administered 2013-12-18: 50 mg via INTRAVENOUS
  Administered 2013-12-18: 200 mg via INTRAVENOUS

## 2013-12-18 MED ORDER — DEXAMETHASONE SODIUM PHOSPHATE 4 MG/ML IJ SOLN
INTRAMUSCULAR | Status: AC
  Filled 2013-12-18: qty 1

## 2013-12-18 MED ORDER — SODIUM CHLORIDE 0.9 % IR SOLN
Status: DC | PRN
  Administered 2013-12-18 (×5): 3000 mL

## 2013-12-18 MED ORDER — MIDAZOLAM HCL 5 MG/ML IJ SOLN: 2.0000 mg | Freq: Once | INTRAMUSCULAR | Status: AC

## 2013-12-18 MED ORDER — OXYCODONE HCL 5 MG PO TABS
5.0000 mg | ORAL_TABLET | Freq: Once | ORAL | Status: AC | PRN
  Administered 2013-12-18: 5 mg via ORAL

## 2013-12-18 MED ORDER — LIDOCAINE HCL (CARDIAC) 20 MG/ML IV SOLN
INTRAVENOUS | Status: DC | PRN
  Administered 2013-12-18: 50 mg via INTRAVENOUS
  Administered 2013-12-18: 60 mg via INTRATRACHEAL

## 2013-12-18 MED ORDER — DEXAMETHASONE SODIUM PHOSPHATE 4 MG/ML IJ SOLN
INTRAMUSCULAR | Status: DC | PRN
  Administered 2013-12-18: 4 mg via INTRAVENOUS

## 2013-12-18 MED ORDER — HYDROMORPHONE HCL PF 1 MG/ML IJ SOLN
0.2500 mg | INTRAMUSCULAR | Status: DC | PRN
  Administered 2013-12-18: 0.5 mg via INTRAVENOUS

## 2013-12-18 MED ORDER — OXYCODONE-ACETAMINOPHEN 5-325 MG PO TABS: 1.0000 | ORAL_TABLET | ORAL | Status: DC | PRN

## 2013-12-18 MED ORDER — ONDANSETRON HCL 4 MG/2ML IJ SOLN
INTRAMUSCULAR | Status: AC
  Filled 2013-12-18: qty 2

## 2013-12-18 MED ORDER — BUPIVACAINE-EPINEPHRINE (PF) 0.5% -1:200000 IJ SOLN
INTRAMUSCULAR | Status: DC | PRN
  Administered 2013-12-18: 30 mL via PERINEURAL

## 2013-12-18 MED ORDER — ONDANSETRON HCL 4 MG/2ML IJ SOLN
INTRAMUSCULAR | Status: DC | PRN
  Administered 2013-12-18: 4 mg via INTRAVENOUS

## 2013-12-18 MED ORDER — OXYCODONE-ACETAMINOPHEN 5-325 MG PO TABS
ORAL_TABLET | ORAL | Status: AC
  Filled 2013-12-18: qty 1

## 2013-12-18 MED ORDER — NEOSTIGMINE METHYLSULFATE 10 MG/10ML IV SOLN
INTRAVENOUS | Status: DC | PRN
  Administered 2013-12-18: 4 mg via INTRAVENOUS

## 2013-12-18 MED ORDER — MIDAZOLAM HCL 2 MG/2ML IJ SOLN
INTRAMUSCULAR | Status: AC
  Filled 2013-12-18: qty 2

## 2013-12-18 MED ORDER — FENTANYL CITRATE 0.05 MG/ML IJ SOLN
INTRAMUSCULAR | Status: AC
  Administered 2013-12-18: 100 ug via INTRAVENOUS
  Filled 2013-12-18: qty 2

## 2013-12-18 MED ORDER — PROMETHAZINE HCL 25 MG/ML IJ SOLN: 6.2500 mg | INTRAMUSCULAR | Status: DC | PRN

## 2013-12-18 MED ORDER — FENTANYL CITRATE 0.05 MG/ML IJ SOLN
INTRAMUSCULAR | Status: AC
  Filled 2013-12-18: qty 5

## 2013-12-18 MED ORDER — DIPHENHYDRAMINE HCL 50 MG/ML IJ SOLN
12.5000 mg | Freq: Once | INTRAMUSCULAR | Status: AC
  Administered 2013-12-18: 12.5 mg via INTRAVENOUS

## 2013-12-18 MED ORDER — OXYCODONE HCL 5 MG PO TABS
ORAL_TABLET | ORAL | Status: AC
  Filled 2013-12-18: qty 1

## 2013-12-18 MED ORDER — MIDAZOLAM HCL 5 MG/5ML IJ SOLN
INTRAMUSCULAR | Status: DC | PRN
  Administered 2013-12-18: 2 mg via INTRAVENOUS

## 2013-12-18 MED ORDER — NEOSTIGMINE METHYLSULFATE 10 MG/10ML IV SOLN
INTRAVENOUS | Status: AC
  Filled 2013-12-18: qty 1

## 2013-12-18 MED ORDER — HYDROMORPHONE HCL PF 1 MG/ML IJ SOLN
INTRAMUSCULAR | Status: DC
Start: 2013-12-18 — End: 2013-12-18
  Filled 2013-12-18: qty 1

## 2013-12-18 MED ORDER — GLYCOPYRROLATE 0.2 MG/ML IJ SOLN
INTRAMUSCULAR | Status: AC
  Filled 2013-12-18: qty 3

## 2013-12-18 MED ORDER — ESMOLOL HCL 10 MG/ML IV SOLN
INTRAVENOUS | Status: AC
  Filled 2013-12-18: qty 10

## 2013-12-18 MED ORDER — NAPROXEN 500 MG PO TABS: 500.0000 mg | ORAL_TABLET | Freq: Two times a day (BID) | ORAL | Status: DC

## 2013-12-18 MED ORDER — FENTANYL CITRATE 0.05 MG/ML IJ SOLN
100.0000 ug | Freq: Once | INTRAMUSCULAR | Status: AC
  Administered 2013-12-18: 100 ug via INTRAVENOUS

## 2013-12-18 MED ORDER — ESMOLOL HCL 10 MG/ML IV SOLN
INTRAVENOUS | Status: DC | PRN
  Administered 2013-12-18: 20 mg via INTRAVENOUS

## 2013-12-18 MED ORDER — GLYCOPYRROLATE 0.2 MG/ML IJ SOLN
INTRAMUSCULAR | Status: DC | PRN
  Administered 2013-12-18: 0.6 mg via INTRAVENOUS

## 2013-12-18 SURGICAL SUPPLY — 65 items
ANCHOR PEEK 4.75X19.1 SWLK C (Anchor) ×6 IMPLANT
ANCHOR PEEK CORKSCREW 4.5 (Anchor) ×4 IMPLANT
ANCHOR PEEK CORKSCREW 4.5MM (Anchor) ×2 IMPLANT
BLADE CUTTER GATOR 3.5 (BLADE) IMPLANT
BLADE GREAT WHITE 4.2 (BLADE) ×2 IMPLANT
BLADE GREAT WHITE 4.2MM (BLADE) ×1
BLADE SURG 11 STRL SS (BLADE) ×3 IMPLANT
BOOTCOVER CLEANROOM LRG (PROTECTIVE WEAR) ×6 IMPLANT
BUR 3.5 LG SPHERICAL (BURR) IMPLANT
BUR OVAL 4.0 (BURR) ×3 IMPLANT
BURR 3.5 LG SPHERICAL (BURR)
BURR 3.5MM LG SPHERICAL (BURR)
CANISTER SUCT LVC 12 LTR MEDI- (MISCELLANEOUS) ×3 IMPLANT
CANNULA ACUFLEX KIT 5X76 (CANNULA) ×3 IMPLANT
CANNULA DRILOCK 5.0MMX75MM (CANNULA) ×3
CANNULA DRILOCK 5.0X75 (CANNULA) ×6 IMPLANT
CLOSURE STERI-STRIP 1/4X4 (GAUZE/BANDAGES/DRESSINGS) ×3 IMPLANT
CLOSURE WOUND 1/2 X4 (GAUZE/BANDAGES/DRESSINGS) ×1
CONNECTOR 5 IN 1 STRAIGHT STRL (MISCELLANEOUS) ×3 IMPLANT
DRAPE INCISE 23X17 IOBAN STRL (DRAPES) ×2
DRAPE INCISE IOBAN 23X17 STRL (DRAPES) ×1 IMPLANT
DRAPE INCISE IOBAN 66X45 STRL (DRAPES) ×3 IMPLANT
DRAPE STERI 35X30 U-POUCH (DRAPES) ×3 IMPLANT
DRAPE SURG 17X11 SM STRL (DRAPES) ×3 IMPLANT
DRAPE U-SHAPE 47X51 STRL (DRAPES) ×3 IMPLANT
DRSG PAD ABDOMINAL 8X10 ST (GAUZE/BANDAGES/DRESSINGS) ×3 IMPLANT
DURAPREP 26ML APPLICATOR (WOUND CARE) ×6 IMPLANT
ELECT REM PT RETURN 9FT ADLT (ELECTROSURGICAL)
ELECTRODE REM PT RTRN 9FT ADLT (ELECTROSURGICAL) IMPLANT
GAUZE SPONGE 4X4 12PLY STRL (GAUZE/BANDAGES/DRESSINGS) ×3 IMPLANT
GLOVE BIO SURGEON STRL SZ7.5 (GLOVE) ×3 IMPLANT
GLOVE BIO SURGEON STRL SZ8 (GLOVE) ×3 IMPLANT
GLOVE EUDERMIC 7 POWDERFREE (GLOVE) ×3 IMPLANT
GLOVE SS BIOGEL STRL SZ 7.5 (GLOVE) ×1 IMPLANT
GLOVE SUPERSENSE BIOGEL SZ 7.5 (GLOVE) ×2
GOWN STRL REUS W/ TWL LRG LVL3 (GOWN DISPOSABLE) ×2 IMPLANT
GOWN STRL REUS W/ TWL XL LVL3 (GOWN DISPOSABLE) ×4 IMPLANT
GOWN STRL REUS W/TWL LRG LVL3 (GOWN DISPOSABLE) ×4
GOWN STRL REUS W/TWL XL LVL3 (GOWN DISPOSABLE) ×8
KIT BASIN OR (CUSTOM PROCEDURE TRAY) ×3 IMPLANT
KIT ROOM TURNOVER OR (KITS) ×3 IMPLANT
KIT SHOULDER TRACTION (DRAPES) ×3 IMPLANT
MANIFOLD NEPTUNE II (INSTRUMENTS) ×3 IMPLANT
NDL SUT 6 .5 CRC .975X.05 MAYO (NEEDLE) ×1 IMPLANT
NEEDLE MAYO TAPER (NEEDLE) ×2
NEEDLE SPNL 18GX3.5 QUINCKE PK (NEEDLE) ×3 IMPLANT
NS IRRIG 1000ML POUR BTL (IV SOLUTION) IMPLANT
PACK SHOULDER (CUSTOM PROCEDURE TRAY) ×3 IMPLANT
PAD ARMBOARD 7.5X6 YLW CONV (MISCELLANEOUS) ×6 IMPLANT
SET ARTHROSCOPY TUBING (MISCELLANEOUS) ×2
SET ARTHROSCOPY TUBING LN (MISCELLANEOUS) ×1 IMPLANT
SLING ARM LRG ADULT FOAM STRAP (SOFTGOODS) IMPLANT
SLING ARM MED ADULT FOAM STRAP (SOFTGOODS) IMPLANT
SPONGE GAUZE 4X4 12PLY STER LF (GAUZE/BANDAGES/DRESSINGS) ×3 IMPLANT
SPONGE LAP 4X18 X RAY DECT (DISPOSABLE) ×3 IMPLANT
STRIP CLOSURE SKIN 1/2X4 (GAUZE/BANDAGES/DRESSINGS) ×2 IMPLANT
SUT MNCRL AB 3-0 PS2 18 (SUTURE) ×3 IMPLANT
SUT PDS AB 0 CT 36 (SUTURE) IMPLANT
SUT RETRIEVER GRASP 30 DEG (SUTURE) ×3 IMPLANT
SYR 20CC LL (SYRINGE) ×3 IMPLANT
TAPE PAPER 3X10 WHT MICROPORE (GAUZE/BANDAGES/DRESSINGS) ×3 IMPLANT
TOWEL OR 17X24 6PK STRL BLUE (TOWEL DISPOSABLE) ×3 IMPLANT
TOWEL OR 17X26 10 PK STRL BLUE (TOWEL DISPOSABLE) ×3 IMPLANT
WAND SUCTION MAX 4MM 90S (SURGICAL WAND) ×3 IMPLANT
WATER STERILE IRR 1000ML POUR (IV SOLUTION) ×3 IMPLANT

## 2013-12-18 NOTE — Anesthesia Procedure Notes (Addendum)
Anesthesia Regional Block:  Interscalene brachial plexus block  Pre-Anesthetic Checklist: ,, timeout performed, Correct Patient, Correct Site, Correct Laterality, Correct Procedure, Correct Position, site marked, Risks and benefits discussed,  Surgical consent,  Pre-op evaluation,  At surgeon's request and post-op pain management  Laterality: Left and Upper  Prep: chloraprep       Needles:   Needle Type: Echogenic Stimulator Needle      Needle Gauge: 22 and 22 G    Additional Needles:  Procedures: nerve stimulator Interscalene brachial plexus block  Nerve Stimulator or Paresthesia:  Response: forearm twitch, 0.45 mA, 0.1 ms,   Additional Responses:   Narrative:  Start time: 12/18/2013 9:19 AM End time: 12/18/2013 9:26 AM Injection made incrementally with aspirations every 5 mL.  Events: injection painful  Performed by: Personally  Anesthesiologist: Jenita Seashore, MD  Additional Notes: Pt identified in Holding room.  Monitors applied. Working IV access confirmed. Sterile prep L neck.  #22ga PNS to forearm twitch at 0.36m threshold.  30cc 0.5% Bupivacaine with 1:200k epi injected incrementally after negative test dose. Initial test painful, needle withdrawn approx 140mand no further arm pain with incremental injection of local,  Patient asymptomatic, VSS, no heme aspirated, tolerated well.  C Jenita SeashoreMD   Procedure Name: Intubation Date/Time: 12/18/2013 10:24 AM Performed by: YAMaryland Pinkre-anesthesia Checklist: Patient identified, Emergency Drugs available, Suction available, Patient being monitored and Timeout performed Patient Re-evaluated:Patient Re-evaluated prior to inductionOxygen Delivery Method: Circle system utilized Preoxygenation: Pre-oxygenation with 100% oxygen Intubation Type: IV induction Ventilation: Mask ventilation without difficulty Laryngoscope Size: Mac and 4 Grade View: Grade I Tube type: Oral Tube size: 7.5 mm Number of attempts: 1 Airway  Equipment and Method: Stylet and LTA kit utilized Placement Confirmation: ETT inserted through vocal cords under direct vision,  positive ETCO2 and breath sounds checked- equal and bilateral Secured at: 22 cm Tube secured with: Tape Dental Injury: Teeth and Oropharynx as per pre-operative assessment

## 2013-12-18 NOTE — Op Note (Signed)
12/18/2013  12:43 PM  PATIENT:   [name]  53 y.o. male  PRE-OPERATIVE DIAGNOSIS:  LEFT SHOULDER ROTATOR CUFF TEAR, AC JOINT OA, IMPINGEMENT  POST-OPERATIVE DIAGNOSIS:  SAME WITH LABRAL TEAR AND PARTIAL BICEP TENDON TEAR  PROCEDURE: LSA, LABRAL DEBRIDEMENT, BICEP TENDON DEBRIDEMENT, SAD, DCR, RCR  SURGEON:  Brixton Franko, Metta Clines. M.D.  ASSISTANTS: Shuford pac   ANESTHESIA:   GET + ISB  EBL: min  SPECIMEN:  none  Drains: none   PATIENT DISPOSITION:  PACU - hemodynamically stable.    PLAN OF CARE: Discharge to home after PACU  Dictation# [id number]

## 2013-12-18 NOTE — Transfer of Care (Signed)
Immediate Anesthesia Transfer of Care Note  Patient: Jonathan Mills  Procedure(s) Performed: Procedure(s): LEFT SHOULDER ARTHROSCOPY WITH SUBACROMIAL DECOMPRESSION,DISTAL CLAVICAL RESECTION ROTATOR CUFF REPAIR  (Left)  Patient Location: PACU  Anesthesia Type:GA combined with regional for post-op pain  Level of Consciousness: sedated and responds to stimulation  Airway & Oxygen Therapy: Patient Spontanous Breathing and Patient connected to nasal cannula oxygen  Post-op Assessment: Report given to PACU RN and Post -op Vital signs reviewed and stable  Post vital signs: Reviewed and stable  Complications: No apparent anesthesia complications

## 2013-12-18 NOTE — Discharge Instructions (Signed)
° °  Kevin M. Supple, M.D., F.A.A.O.S. °Orthopaedic Surgery °Specializing in Arthroscopic and Reconstructive °Surgery of the Shoulder and Knee °[phone] °[address] - [place], [place] [address] - Fax [phone] ° °POST-OP SHOULDER ARTHROSCOPIC ROTATOR CUFF AND/OR LABRAL REPAIR INSTRUCTIONS ° °1. Call the office at [phone] to schedule your first post-op appointment 7-10 days from the date of your surgery. ° °2. Leave the steri-strips in place over your incisions when performing dressing changes and showering. You may remove your dressings and begin showering 72 hours from surgery. You can expect drainage that is clear to bloody in nature that occasionally will soak through your dressings. If this occurs go ahead and perform a dressing change. The drainage should lessen daily and when there is no drainage from your incisions feel free to go without a dressing. ° °3. Wear your sling/immobilizer at all times except to perform the exercises below or to occasionally let your arm dangle by your side to stretch your elbow. You also need to sleep in your sling immobilizer until instructed otherwise. ° °4. Range of motion to your elbow, wrist, and hand are encouraged 3-5 times daily. Exercise to your hand and fingers helps to reduce swelling you may experience. ° °5. Utilize ice to the shoulder 3-4 times minimum a day and additionally if you are experiencing pain. ° °6. You may one-armed drive when safely off of narcotics and muscle relaxants. You may use your hand that is in the sling to support the steering wheel only. However, should it be your right arm that is in the sling it is not to be used for gear shifting in a manual transmission. ° °7. If you had a block pre-operatively to provide post-op pain relief you may want to go ahead and begin utilizing your pain meds as your arm begins to wake up. Blocks can sometimes last up to 16-18 hours. If you are still pain-free prior to going to bed you may  want to strongly consider taking a pain medication to avoid being awakened in the night with the onset of pain. A muscle relaxant is also provided for you should you experience muscle spasms. It is recommended that if you are experiencing pain that your pain medication alone is not controlling, add the muscle relaxant along with the pain medication which can give additional pain relief. The first one to two days is generally the most severe of your pain and then should gradually decrease. As your pain lessens it is recommended that you decrease your use of the pain medications to an "as needed basis" only and to always comply with the recommended dosages of the pain medications. ° °8. Pain medications can produce constipation along with their use. If you experience this, the use of an over the counter stool softener or laxative daily is recommended.  ° °9. For additional questions or concerns, please do not hesitate to call the office. If after hours there is an answering service to forward your concerns to the physician on call. ° °POST-OP EXERCISES ° °Pendulum Exercises ° °Perform pendulum exercises while standing and bending at the waist. Support your uninvolved arm on a table or chair and allow your operated arm to hang freely. Make sure to do these exercises passively - not using you shoulder muscle. ° °Repeat 20 times. Do 3 sessions per day. ° ° °

## 2013-12-18 NOTE — Anesthesia Preprocedure Evaluation (Signed)
Anesthesia Evaluation  Patient identified by MRN, date of birth, ID band Patient awake    Reviewed: Allergy & Precautions, H&P , NPO status , Patient's Chart, lab work & pertinent test results  History of Anesthesia Complications Negative for: history of anesthetic complications  Airway Mallampati: I TM Distance: >3 FB Neck ROM: Full    Dental  (+) Teeth Intact, Dental Advisory Given   Pulmonary neg pulmonary ROS,  breath sounds clear to auscultation  Pulmonary exam normal       Cardiovascular negative cardio ROS  Rhythm:Regular Rate:Normal     Neuro/Psych Anxiety negative neurological ROS     GI/Hepatic Neg liver ROS, GERD-  Medicated and Controlled,  Endo/Other  negative endocrine ROS  Renal/GU negative Renal ROS     Musculoskeletal   Abdominal   Peds  Hematology negative hematology ROS (+)   Anesthesia Other Findings   Reproductive/Obstetrics                           Anesthesia Physical Anesthesia Plan  ASA: II  Anesthesia Plan: General   Post-op Pain Management:    Induction: Intravenous  Airway Management Planned: Oral ETT  Additional Equipment:   Intra-op Plan:   Post-operative Plan: Extubation in OR  Informed Consent: I have reviewed the patients History and Physical, chart, labs and discussed the procedure including the risks, benefits and alternatives for the proposed anesthesia with the patient or authorized representative who has indicated his/her understanding and acceptance.   Dental advisory given  Plan Discussed with: CRNA and Surgeon  Anesthesia Plan Comments: (Plan routine monitors, GETA with interscalene block for post op analgesia)        Anesthesia Quick Evaluation

## 2013-12-18 NOTE — H&P (Signed)
Jonathan Mills    Chief Complaint: LEFT SHOULDER ROTATOR CUFF TEAR HPI: The patient is a 53 y.o. male with large retracted tear of left shoulder rotator cuff and ongoing pain and functional limitations  Past Medical History  Diagnosis Date  . Vitamin D deficiency 07/12/2011  . ALLERGIC RHINITIS 10/07/2008  . GERD 10/07/2008  . HYPERLIPIDEMIA 10/07/2008  . COLONIC POLYPS, HX OF 10/07/2008  . Hypertriglyceridemia 07/12/2011  . Headache(784.0)     sinus  . Mood swings     Past Surgical History  Procedure Laterality Date  . Right ear/neck surgury  1997  . Eye surgery      LASIK      Family History  Problem Relation Age of Onset  . Cancer Father     Social History:  reports that he has never smoked. He does not have any smokeless tobacco history on file. He reports that he drinks about .6 ounces of alcohol per week. He reports that he does not use illicit drugs.  Allergies:  Allergies  Allergen Reactions  . Penicillins     REACTION: rash    Medications Prior to Admission  Medication Sig Dispense Refill  . acetaminophen (TYLENOL) 500 MG tablet Take 1,000 mg by mouth every 6 (six) hours as needed (for pain).      . fenofibrate 160 MG tablet Take 160 mg by mouth daily.      Marland Kitchen HYDROcodone-acetaminophen (NORCO/VICODIN) 5-325 MG per tablet Take 1 tablet by mouth at bedtime.      . lamoTRIgine (LAMICTAL) 25 MG tablet Take 25 mg by mouth 2 (two) times daily.      Marland Kitchen omeprazole (PRILOSEC) 20 MG capsule Take 1 capsule (20 mg total) by mouth daily.  90 capsule  3  . triamcinolone (NASACORT AQ) 55 MCG/ACT AERO nasal inhaler Place 2 sprays into the nose daily.  3 Inhaler  3     Physical Exam: left shoulder with painful and restricted motion as noted at recent office visits  Vitals  Temp:  [98 F (36.7 C)] 98 F (36.7 C) (08/06 0829) Pulse Rate:  [77] 77 (08/06 0829) Resp:  [20] 20 (08/06 0829) BP: (115)/(83) 115/83 mmHg (08/06 0829) SpO2:  [99 %] 99 % (08/06 0829) Weight:  [86.637 kg  (191 lb)] 86.637 kg (191 lb) (08/06 0829)  Assessment/Plan  Impression: LEFT SHOULDER ROTATOR CUFF TEAR  Plan of Action: Procedure(s): LEFT SHOULDER ARTHROSCOPY WITH SUBACROMIAL DECOMPRESSION,DISTAL CLAVICAL RESECTION ROTATOR CUFF REPAIR   [name] M 12/18/2013, 9:25 AM

## 2013-12-19 ENCOUNTER — Encounter (HOSPITAL_COMMUNITY): Payer: Self-pay | Admitting: Orthopedic Surgery

## 2013-12-19 NOTE — Anesthesia Postprocedure Evaluation (Signed)
  Anesthesia Post-op Note  Patient: Jonathan Mills  Procedure(s) Performed: Procedure(s): LEFT SHOULDER ARTHROSCOPY WITH SUBACROMIAL DECOMPRESSION,DISTAL CLAVICAL RESECTION ROTATOR CUFF REPAIR  (Left)  Patient Location: PACU  Anesthesia Type:GA combined with regional for post-op pain  Level of Consciousness: awake, alert , oriented and patient cooperative  Airway and Oxygen Therapy: Patient Spontanous Breathing  Post-op Pain: mild  Post-op Assessment: Post-op Vital signs reviewed, Patient's Cardiovascular Status Stable, Respiratory Function Stable, Patent Airway, No signs of Nausea or vomiting and Pain level controlled  Post-op Vital Signs: Reviewed and stable  Last Vitals:  Filed Vitals:   12/18/13 1526  BP: 112/75  Pulse: 67  Temp: 36.6 C  Resp: 14    Complications: No apparent anesthesia complications

## 2013-12-19 NOTE — Op Note (Signed)
NAME:  Jonathan Mills                    ACCOUNT NO.:  [id number]  MEDICAL RECORD NO.:  [id number]  LOCATION:  [facility]                         FACILITY:  [facility]  PHYSICIAN:  Metta Clines. Corinthian Kemler, M.D.  DATE OF BIRTH:  Nov 10, 1960  DATE OF PROCEDURE:  12/18/2013 DATE OF DISCHARGE:                              OPERATIVE REPORT   PREOPERATIVE DIAGNOSES: 1. Massive retracted left shoulder rotator cuff tear. 2. Left shoulder impingement syndrome. 3. Left shoulder acromioclavicular joint arthropathy.  POSTOPERATIVE DIAGNOSES: 1. Massive retracted left shoulder rotator cuff tear. 2. Left shoulder impingement syndrome. 3. Left shoulder acromioclavicular joint arthropathy. 4. Degenerative labral tear. 5. Partial biceps tendon tear.  PROCEDURE: 1. Left shoulder examination under anesthesia. 2. Left shoulder glenoid joint diagnostic arthroscopy. 3. Debridement of partial biceps tendon tear. 4. Debridement of degenerative labral tear. 5. Arthroscopic subacromial decompression and bursectomy. 6. Arthroscopic distal clavicle resection. 7. Arthroscopic repair of massive rotator cuff tear involving the     entire supra and infraspinatus tendons with __________ greater than     4 cm in width.  SURGEON:  Metta Clines. Tkai Serfass, MD  ASSISTANT:  Reather Laurence. Shuford, PA-C  ANESTHESIA:  General endotracheal as well as an interscalene block.  ESTIMATED BLOOD LOSS:  Minimal.  DRAINS:  None.  HISTORY:  Jonathan Mills is a 53 year old gentleman, who sustained a lifting injury to his left shoulder several weeks ago when he felt "ripping and tearing" on the shoulder with subsequent development of severe pain and inability to actively elevate the arm.  On evaluation in our office, he was found to have profound weakness with testing of the shoulder girdle musculature particularly abduction and external rotators and a subsequent MRI scan confirmed a large retracted tear of the entirety of the supra and infraspinatus.  Due to  his ongoing pain, weakness, functional limitations, and the MRI findings, he was brought to the operating room at this time for planned left shoulder arthroscopy with rotator cuff repair as described below.  Preoperatively, I counseled Jonathan Mills regarding treatment options and risks versus benefits thereof.  Possible surgical complications were reviewed including the potential for bleeding, infection, neurovascular injury, persistent pain, loss of motion, anesthetic complication, recurrence of rotator cuff tear, possible need for additional surgery. He understands and accepts and agrees with our planned procedure.  PROCEDURE IN DETAIL:  After undergoing routine preoperative evaluation, the patient received prophylactic antibiotics.  An interscalene block was established in holding area by the Anesthesia Department, placed supine on the operating table, underwent smooth induction of general endotracheal anesthesia.  Turned to the right lateral decubitus position on a beanbag and appropriately padded and protected.  Left shoulder examination under anesthesia revealed full motion and no instability patterns were noted.  Left arm was suspended at 70 degrees abduction with 10 pounds of traction.  Left shoulder girdle region was sterilely prepped and draped in standard fashion.  Time-out was called.  A posterior portal was established in the glenohumeral joint.  The anterior portal was established under direct visualization. Immediately, evident was a large retracted tear of the entirety of the supra and the infraspinatus.  We also found that the biceps tendon showed  some partial tearing distally as it entered into the bicipital groove and while there was no evidence for medial subluxation, it was frayed and would estimate this accounted for close to 25% of the thickness of the tendon.  We went ahead and debrided the biceps tendon to healthy tissue in stable and smooth margin.  Being given the  overall quality of the tissue and then stability, did not feel that any further treatment was indicated other than the debridement.  He also had some degenerative tearing of the labrum which we debrided.  At this point, fluid and instruments were removed from the glenohumeral joint.  The arm was dropped down to 30 degrees abduction with the arthroscope introduced in the subacromial space to the posterior portal and a direct lateral portal was established in the subacromial space.  Abundant dense bursal tissue and multiple adhesions were encountered, and these were all divided and excised with a combination of shaver and Stryker wand.  The wand was then used to remove the periosteum from the undersurface of the anterior half of the acromion.  There was a very prominent anterior- inferior acromial "hook."  A bur was introduced and used to perform subacromial decompression creating a type 1 morphology.  A portal was then established directly anterior to the distal clavicle and the distal clavicle resection was performed with a bur.  Care was taken to confirm visualization of the entire circumference of the distal clavicle to ensure adequate removal of bone.  We then completed the subacromial/subdeltoid bursectomy and divided adhesions particularly posteriorly, clearly identifying the free margin of the rotator cuff tendons.  Postprocedure, bursal tissues in this interval was appropriately developed and identified.  At this point, I used a elevator to mobilize the rotator cuff to confirm that had appropriate elasticity to bring it back over the greater tuberosity.  This was completed on both the articular and bursal side.  There was indeed good elasticity of the vascular tendons unit.  Soft tissue tuberosity was then removed and the greater tuberosity was gently abraded with the bur to bony bed with a __________ greater than 4 cm in width.  Accessory portal device __________ was established  and through a stab wound on the lateral margin of the acromion, placed an Arthrex PEEK corkscrew suture anchor.  The limbs of the suture anchor were then shuttled through the free margin of the rotator cuff.  Using 2 Corkscrew suture anchors, and totaled 8 suture limbs, all passed equidistant across the width of the tear, and these were then subsequently tied with sliding locking knots followed by multiple overhand throws and alternating posts.  I then created "suture bridge" with 2 SwiveLock suture anchors, which nicely compressed the free margin of rotator cuff against bony bed of tuberosity.  The overall construct was much to our satisfaction.  Suture limbs were clipped.  Hemostasis was obtained.  Bursectomy completed. Fluid and instruments removed.  The portal was closed with Monocryl and Steri-Strips.  A dry dressing taped at the left shoulder, left arm was placed in a sling.  The patient was awakened, extubated, and taken to the recovery room in stable condition.  Jenetta Loges, PA-C, was used as an Environmental consultant throughout this case, essential for help with positioning of the patient, positioning of the extremity, and management of the arthroscopic equipment, tissue manipulation, suture management, wound closure, and intraoperative decision making.     Metta Clines. Vrishank Moster, M.D.     KMS/MEDQ  D:  12/18/2013  T:  12/18/2013  Job:  [id number]

## 2014-04-24 ENCOUNTER — Other Ambulatory Visit: Payer: Self-pay

## 2014-04-24 ENCOUNTER — Telehealth: Payer: Self-pay | Admitting: Internal Medicine

## 2014-04-24 MED ORDER — BUDESONIDE 32 MCG/ACT NA SUSP: 2.0000 | Freq: Every day | NASAL | Status: DC

## 2014-04-24 MED ORDER — TRIAMCINOLONE ACETONIDE 55 MCG/ACT NA AERO: 2.0000 | INHALATION_SPRAY | Freq: Every day | NASAL | Status: DC

## 2014-04-24 NOTE — Telephone Encounter (Signed)
Pt request phone call concern about the nasacort that we send in to [facility]. Please call pt, he has concern about this med.

## 2014-04-24 NOTE — Telephone Encounter (Signed)
The patient states nasacort is unavailable and would like alternative sent to [facility].

## 2014-04-24 NOTE — Telephone Encounter (Signed)
rx done for rhinocort

## 2014-05-01 ENCOUNTER — Telehealth: Payer: Self-pay | Admitting: *Deleted

## 2014-05-01 MED ORDER — TRIAMCINOLONE ACETONIDE 55 MCG/ACT NA AERO: 2.0000 | INHALATION_SPRAY | Freq: Every day | NASAL | Status: DC

## 2014-05-01 NOTE — Telephone Encounter (Signed)
Done erx 

## 2014-05-01 NOTE — Telephone Encounter (Signed)
Left msg on triage stating the triamcinolone nasal spray has been d/c. Requesting md to rx something else. When calling back use ref# [id number]...Jonathan Mills

## 2014-08-05 ENCOUNTER — Other Ambulatory Visit: Payer: Self-pay | Admitting: Internal Medicine

## 2014-08-14 ENCOUNTER — Ambulatory Visit (INDEPENDENT_AMBULATORY_CARE_PROVIDER_SITE_OTHER): Payer: 59 | Admitting: Internal Medicine

## 2014-08-14 VITALS — BP 122/80 | HR 75 | Temp 98.4°F | Resp 18 | Ht 72.0 in | Wt 192.1 lb

## 2014-08-14 DIAGNOSIS — J019 Acute sinusitis, unspecified: Secondary | ICD-10-CM | POA: Diagnosis not present

## 2014-08-14 DIAGNOSIS — J309 Allergic rhinitis, unspecified: Secondary | ICD-10-CM

## 2014-08-14 MED ORDER — LEVOFLOXACIN 500 MG PO TABS: 500.0000 mg | ORAL_TABLET | Freq: Every day | ORAL | Status: DC

## 2014-08-14 NOTE — Assessment & Plan Note (Signed)
Mild to mod, for antibx course,  to f/u any worsening symptoms or concerns 

## 2014-08-14 NOTE — Progress Notes (Signed)
Subjective:    Patient ID: Jonathan Mills, male    DOB: Feb 28, 1961, 54 y.o.   MRN: [id number]  HPI   Here with 3 days acute onset fever, facial pain, pressure, headache, metallic taste in mouth, general weakness and malaise, and greenish d/c, with mild ST and scant prod cough, but pt denies chest pain, wheezing, increased sob or doe, orthopnea, PND, increased LE swelling, palpitations, dizziness or syncope. Takes the nasacort daily, feels his allergies are relatively well controlled Past Medical History  Diagnosis Date  . Vitamin D deficiency 07/12/2011  . ALLERGIC RHINITIS 10/07/2008  . GERD 10/07/2008  . HYPERLIPIDEMIA 10/07/2008  . COLONIC POLYPS, HX OF 10/07/2008  . Hypertriglyceridemia 07/12/2011  . Headache(784.0)     sinus  . Mood swings    Past Surgical History  Procedure Laterality Date  . Right ear/neck surgury  1997  . Eye surgery      LASIK    . Shoulder arthroscopy with subacromial decompression, rotator cuff repair and bicep tendon repair Left 12/18/2013    Procedure: LEFT SHOULDER ARTHROSCOPY WITH SUBACROMIAL DECOMPRESSION,DISTAL CLAVICAL RESECTION ROTATOR CUFF REPAIR ;  Surgeon: Marin Shutter, MD;  Location: [facility];  Service: Orthopedics;  Laterality: Left;    reports that he has never smoked. He does not have any smokeless tobacco history on file. He reports that he drinks about 0.6 oz of alcohol per week. He reports that he does not use illicit drugs. family history includes Cancer in his father. Allergies  Allergen Reactions  . Penicillins     REACTION: rash   Current Outpatient Prescriptions on File Prior to Visit  Medication Sig Dispense Refill  . fenofibrate 160 MG tablet Take 160 mg by mouth daily.    Marland Kitchen omeprazole (PRILOSEC) 20 MG capsule TAKE 1 CAPSULE DAILY 90 capsule 1  . triamcinolone (NASACORT AQ) 55 MCG/ACT AERO nasal inhaler Place 2 sprays into the nose daily. 3 Inhaler 3   No current facility-administered medications on file prior to visit.   Review of  Systems  Constitutional: Negative for unusual diaphoresis or night sweats HENT: Negative for ringing in ear or discharge Eyes: Negative for double vision or worsening visual disturbance.  Respiratory: Negative for choking and stridor.   Gastrointestinal: Negative for vomiting or other signifcant bowel change Genitourinary: Negative for hematuria or change in urine volume.  Musculoskeletal: Negative for other MSK pain or swelling Skin: Negative for color change and worsening wound.  Neurological: Negative for tremors and numbness other than noted  Psychiatric/Behavioral: Negative for decreased concentration or agitation other than above       Objective:   Physical Exam BP 122/80 mmHg  Pulse 75  Temp(Src) 98.4 F (36.9 C) (Oral)  Resp 18  Ht 6' (1.829 m)  Wt 192 lb 1.3 oz (87.127 kg)  BMI 26.05 kg/m2  SpO2 97% VS noted, mild ill Constitutional: Pt appears in no significant distress HENT: Head: NCAT.  Right Ear: External ear normal.  Left Ear: External ear normal.  Eyes: . Pupils are equal, round, and reactive to light. Conjunctivae and EOM are normal Bilat tm's with mild erythema.  Max sinus areas mild tender.  Pharynx with mild erythema, no exudate Neck: Normal range of motion. Neck supple.  Cardiovascular: Normal rate and regular rhythm.   Pulmonary/Chest: Effort normal and breath sounds without rales or wheezing.  Neurological: Pt is alert. Not confused , motor grossly intact Skin: Skin is warm. No rash, no LE edema Psychiatric: Pt behavior is normal. No  agitation.     Assessment & Plan:

## 2014-08-14 NOTE — Patient Instructions (Signed)
Please take all new medication as prescribed - the antibiotic  You can also take Delsym OTC for cough, and/or Mucinex (or it's generic off brand) for congestion, and tylenol as needed for pain.  Please continue all other medications as before, and refills have been done if requested.  Please have the pharmacy call with any other refills you may need.  Please keep your appointments with your specialists as you may have planned    

## 2014-08-14 NOTE — Progress Notes (Signed)
Pre visit review using our clinic review tool, if applicable. No additional management support is needed unless otherwise documented below in the visit note. 

## 2014-08-14 NOTE — Assessment & Plan Note (Signed)
stable overall by history and exam, and pt to continue medical treatment as before,  to f/u any worsening symptoms or concerns 

## 2014-09-29 ENCOUNTER — Encounter: Payer: Self-pay | Admitting: Internal Medicine

## 2014-09-29 ENCOUNTER — Ambulatory Visit (INDEPENDENT_AMBULATORY_CARE_PROVIDER_SITE_OTHER): Payer: 59 | Admitting: Internal Medicine

## 2014-09-29 VITALS — BP 110/72 | HR 64 | Temp 98.4°F | Wt 194.0 lb

## 2014-09-29 DIAGNOSIS — F411 Generalized anxiety disorder: Secondary | ICD-10-CM

## 2014-09-29 DIAGNOSIS — J309 Allergic rhinitis, unspecified: Secondary | ICD-10-CM | POA: Diagnosis not present

## 2014-09-29 DIAGNOSIS — J069 Acute upper respiratory infection, unspecified: Secondary | ICD-10-CM | POA: Diagnosis not present

## 2014-09-29 MED ORDER — CLARITHROMYCIN 500 MG PO TABS: 500.0000 mg | ORAL_TABLET | Freq: Two times a day (BID) | ORAL | Status: DC

## 2014-09-29 NOTE — Progress Notes (Signed)
Pre visit review using our clinic review tool, if applicable. No additional management support is needed unless otherwise documented below in the visit note. 

## 2014-09-29 NOTE — Patient Instructions (Signed)
Please take all new medication as prescribed  Please continue all other medications as before, and refills have been done if requested.  Please have the pharmacy call with any other refills you may need.  Please continue your efforts at being more active, low cholesterol diet, and weight control.  Please keep your appointments with your specialists as you may have planned    

## 2014-09-30 NOTE — Progress Notes (Signed)
Subjective:    Patient ID: Jonathan Mills, male    DOB: Mar 26, 1961, 54 y.o.   MRN: [id number]  HPI   Here with 2-3 days acute onset fever, severe ST, mild facial pain, pressure, headache, general weakness and malaise, and non prod mild cough, but pt denies chest pain, wheezing, increased sob or doe, orthopnea, PND, increased LE swelling, palpitations, dizziness or syncope.  Had a daughter ill with URI a few days ago. Overall good compliance with treatment, and good medicine tolerability including for allergies and not been a significant recent issue.  Denies worsening depressive symptoms, suicidal ideation, or panic Past Medical History  Diagnosis Date  . Vitamin D deficiency 07/12/2011  . ALLERGIC RHINITIS 10/07/2008  . GERD 10/07/2008  . HYPERLIPIDEMIA 10/07/2008  . COLONIC POLYPS, HX OF 10/07/2008  . Hypertriglyceridemia 07/12/2011  . Headache(784.0)     sinus  . Mood swings    Past Surgical History  Procedure Laterality Date  . Right ear/neck surgury  1997  . Eye surgery      LASIK    . Shoulder arthroscopy with subacromial decompression, rotator cuff repair and bicep tendon repair Left 12/18/2013    Procedure: LEFT SHOULDER ARTHROSCOPY WITH SUBACROMIAL DECOMPRESSION,DISTAL CLAVICAL RESECTION ROTATOR CUFF REPAIR ;  Surgeon: Marin Shutter, MD;  Location: [facility];  Service: Orthopedics;  Laterality: Left;    reports that he has never smoked. He does not have any smokeless tobacco history on file. He reports that he drinks about 0.6 oz of alcohol per week. He reports that he does not use illicit drugs. family history includes Cancer in his father. Allergies  Allergen Reactions  . Penicillins     REACTION: rash   Current Outpatient Prescriptions on File Prior to Visit  Medication Sig Dispense Refill  . fenofibrate 160 MG tablet Take 160 mg by mouth daily.    Marland Kitchen omeprazole (PRILOSEC) 20 MG capsule TAKE 1 CAPSULE DAILY 90 capsule 1  . triamcinolone (NASACORT AQ) 55 MCG/ACT AERO nasal inhaler  Place 2 sprays into the nose daily. 3 Inhaler 3   No current facility-administered medications on file prior to visit.    Review of Systems  Constitutional: Negative for unusual diaphoresis or night sweats HENT: Negative for ringing in ear or discharge Eyes: Negative for double vision or worsening visual disturbance.  Respiratory: Negative for choking and stridor.   Gastrointestinal: Negative for vomiting or other signifcant bowel change Genitourinary: Negative for hematuria or change in urine volume.  Musculoskeletal: Negative for other MSK pain or swelling Skin: Negative for color change and worsening wound.  Neurological: Negative for tremors and numbness other than noted  Psychiatric/Behavioral: Negative for decreased concentration or agitation other than above       Objective:   Physical Exam BP 110/72 mmHg  Pulse 64  Temp(Src) 98.4 F (36.9 C) (Oral)  Wt 194 lb 0.6 oz (88.016 kg)  SpO2 97% VS noted,  Constitutional: Pt appears in no significant distress HENT: Head: NCAT.  Right Ear: External ear normal.  Left Ear: External ear normal.  Eyes: . Pupils are equal, round, and reactive to light. Conjunctivae and EOM are normal Bilat tm's with mild erythema.  Max sinus areas mild tender.  Pharynx with severe erythema, ? + exudate Neck: Normal range of motion. Neck supple.but with tender bilat submandib lymphadenopathy  Cardiovascular: Normal rate and regular rhythm.   Pulmonary/Chest: Effort normal and breath sounds without rales or wheezing.  Neurological: Pt is alert. Not confused , motor grossly  intact Skin: Skin is warm. No rash, no LE edema Psychiatric: Pt behavior is normal. No agitation. not depressed affect    Assessment & Plan:

## 2014-09-30 NOTE — Assessment & Plan Note (Signed)
stable overall by history and exam, , and pt to continue medical treatment as before,  to f/u any worsening symptoms or concerns  

## 2014-09-30 NOTE — Assessment & Plan Note (Signed)
stable overall by history and exam, and pt to continue medical treatment as before,  to f/u any worsening symptoms or concerns 

## 2014-09-30 NOTE — Assessment & Plan Note (Signed)
Cant r/o strep - Mild to mod, for antibx course,  to f/u any worsening symptoms or concerns

## 2014-10-16 ENCOUNTER — Telehealth: Payer: Self-pay | Admitting: Internal Medicine

## 2014-10-16 NOTE — Telephone Encounter (Signed)
error 

## 2014-10-28 ENCOUNTER — Encounter: Payer: Self-pay | Admitting: Internal Medicine

## 2014-10-28 ENCOUNTER — Other Ambulatory Visit (INDEPENDENT_AMBULATORY_CARE_PROVIDER_SITE_OTHER): Payer: 59

## 2014-10-28 ENCOUNTER — Ambulatory Visit (INDEPENDENT_AMBULATORY_CARE_PROVIDER_SITE_OTHER): Payer: 59 | Admitting: Internal Medicine

## 2014-10-28 VITALS — BP 110/64 | HR 79 | Temp 97.7°F | Ht 72.0 in | Wt 200.0 lb

## 2014-10-28 DIAGNOSIS — Z23 Encounter for immunization: Secondary | ICD-10-CM | POA: Diagnosis not present

## 2014-10-28 DIAGNOSIS — R21 Rash and other nonspecific skin eruption: Secondary | ICD-10-CM | POA: Diagnosis not present

## 2014-10-28 DIAGNOSIS — R413 Other amnesia: Secondary | ICD-10-CM | POA: Diagnosis not present

## 2014-10-28 DIAGNOSIS — Z0000 Encounter for general adult medical examination without abnormal findings: Secondary | ICD-10-CM | POA: Diagnosis not present

## 2014-10-28 DIAGNOSIS — F419 Anxiety disorder, unspecified: Secondary | ICD-10-CM

## 2014-10-28 LAB — URINALYSIS, ROUTINE W REFLEX MICROSCOPIC
BILIRUBIN URINE: NEGATIVE
Hgb urine dipstick: NEGATIVE
Ketones, ur: NEGATIVE
Leukocytes, UA: NEGATIVE
NITRITE: NEGATIVE
RBC / HPF: NONE SEEN (ref 0–?)
Specific Gravity, Urine: 1.02 (ref 1.000–1.030)
TOTAL PROTEIN, URINE-UPE24: NEGATIVE
URINE GLUCOSE: NEGATIVE
Urobilinogen, UA: 0.2 (ref 0.0–1.0)
pH: 7 (ref 5.0–8.0)

## 2014-10-28 LAB — BASIC METABOLIC PANEL
BUN: 16 mg/dL (ref 6–23)
CHLORIDE: 106 meq/L (ref 96–112)
CO2: 31 mEq/L (ref 19–32)
Calcium: 9.8 mg/dL (ref 8.4–10.5)
Creatinine, Ser: 1.33 mg/dL (ref 0.40–1.50)
GFR: 59.82 mL/min — ABNORMAL LOW (ref >60.00)
Glucose, Bld: 90 mg/dL (ref 70–99)
Potassium: 4.2 mEq/L (ref 3.5–5.1)
SODIUM: 141 meq/L (ref 135–145)

## 2014-10-28 LAB — CBC WITH DIFFERENTIAL/PLATELET
BASOS ABS: 0 10*3/uL (ref 0.0–0.1)
Basophils Relative: 0.5 % (ref 0.0–3.0)
Eosinophils Absolute: 0.1 10*3/uL (ref 0.0–0.7)
Eosinophils Relative: 2.7 % (ref 0.0–5.0)
HCT: 43.6 % (ref 39.0–52.0)
Hemoglobin: 14.5 g/dL (ref 13.0–17.0)
Lymphocytes Relative: 35.1 % (ref 12.0–46.0)
Lymphs Abs: 1.6 10*3/uL (ref 0.7–4.0)
MCHC: 33.3 g/dL (ref 30.0–36.0)
MCV: 87.3 fl (ref 78.0–100.0)
MONOS PCT: 8.7 % (ref 3.0–12.0)
Monocytes Absolute: 0.4 10*3/uL (ref 0.1–1.0)
NEUTROS ABS: 2.4 10*3/uL (ref 1.4–7.7)
Neutrophils Relative %: 53 % (ref 43.0–77.0)
PLATELETS: 162 10*3/uL (ref 150.0–400.0)
RBC: 4.99 Mil/uL (ref 4.22–5.81)
RDW: 12.5 % (ref 11.5–15.5)
WBC: 4.5 10*3/uL (ref 4.0–10.5)

## 2014-10-28 LAB — HEPATIC FUNCTION PANEL
ALBUMIN: 4.7 g/dL (ref 3.5–5.2)
ALT: 27 U/L (ref 0–53)
AST: 22 U/L (ref 0–37)
Alkaline Phosphatase: 48 U/L (ref 39–117)
BILIRUBIN DIRECT: 0.1 mg/dL (ref 0.0–0.3)
TOTAL PROTEIN: 7.2 g/dL (ref 6.0–8.3)
Total Bilirubin: 0.5 mg/dL (ref 0.2–1.2)

## 2014-10-28 LAB — LIPID PANEL
Cholesterol: 186 mg/dL (ref 0–200)
HDL: 52.6 mg/dL (ref >39.00)
LDL Cholesterol: 103 mg/dL — ABNORMAL HIGH (ref 0–99)
NONHDL: 133.4
TRIGLYCERIDES: 151 mg/dL — AB (ref 0.0–149.0)
Total CHOL/HDL Ratio: 4
VLDL: 30.2 mg/dL (ref 0.0–40.0)

## 2014-10-28 LAB — PSA: PSA: 0.61 ng/mL (ref 0.10–4.00)

## 2014-10-28 LAB — TSH: TSH: 1.36 u[IU]/mL (ref 0.35–4.50)

## 2014-10-28 MED ORDER — FENOFIBRATE 160 MG PO TABS: 160.0000 mg | ORAL_TABLET | Freq: Every day | ORAL | Status: DC

## 2014-10-28 MED ORDER — OMEPRAZOLE 20 MG PO CPDR: 20.0000 mg | DELAYED_RELEASE_CAPSULE | Freq: Every day | ORAL | Status: DC

## 2014-10-28 MED ORDER — TRIAMCINOLONE ACETONIDE 55 MCG/ACT NA AERO
2.0000 | INHALATION_SPRAY | Freq: Every day | NASAL | Status: DC
Start: 2014-10-28 — End: 2014-10-29

## 2014-10-28 NOTE — Patient Instructions (Addendum)
You had the Tdap shot today (tetanus)  You can try the OTC Cortaid for the rash  Please continue all other medications as before, and refills have been done if requested.  Please have the pharmacy call with any other refills you may need.  Please continue your efforts at being more active, low cholesterol diet, and weight control.  You are otherwise up to date with prevention measures today.  Please keep your appointments with your specialists as you may have planned  You will be contacted regarding the referral for: Psychology ([place]), and Dermatology  Please go to the LAB in the Basement (turn left off the elevator) for the tests to be done today  You will be contacted by phone if any changes need to be made immediately.  Otherwise, you will receive a letter about your results with an explanation, but please check with MyChart first.  Please remember to sign up for MyChart if you have not done so, as this will be important to you in the future with finding out test results, communicating by private email, and scheduling acute appointments online when needed.  Please return in 1 year for your yearly visit, or sooner if needed, with Lab testing done 3-5 days before

## 2014-10-28 NOTE — Assessment & Plan Note (Signed)
?   Contact derm vs other - for derm referral

## 2014-10-28 NOTE — Addendum Note (Signed)
Addended by: Biagio Borg on: 10/28/2014 07:33 PM   Modules accepted: Miquel Dunn

## 2014-10-28 NOTE — Addendum Note (Signed)
Addended by: Lyman Bishop on: 10/28/2014 09:56 AM   Modules accepted: Orders

## 2014-10-28 NOTE — Assessment & Plan Note (Addendum)

## 2014-10-28 NOTE — Progress Notes (Signed)
Subjective:    Patient ID: Jonathan Mills, male    DOB: 09/27/1960, 54 y.o.   MRN: [id number]  HPI  Here for wellness and f/u;  Overall doing ok;  Pt denies Chest pain, worsening SOB, DOE, wheezing, orthopnea, PND, worsening LE edema, palpitations, dizziness or syncope.  Pt denies neurological change such as new headache, facial or extremity weakness.  Pt denies polydipsia, polyuria, or low sugar symptoms. Pt states overall good compliance with treatment and medications, good tolerability, and has been trying to follow appropriate diet.  Pt denies worsening depressive symptoms, suicidal ideation or panic. No fever, night sweats, wt loss, loss of appetite, or other constitutional symptoms.  Pt states good ability with ADL's, has low fall risk, home safety reviewed and adequate, no other significant changes in hearing or vision, and only occasionally active with exercise.  Has a wart to left lateral thigh, asks if needs to come off.  Still with recurrent chronic sinus, saw ENT, tx with clindamycin po tid x 21 days, now on day 6, has some loose bowels but no frank diarrhea., no abd pain or diarrhea.   Has some anxiety about memory difficulty, father with brain tumor and m-gpa with dementia, asks for counseling Past Medical History  Diagnosis Date  . Vitamin D deficiency 07/12/2011  . ALLERGIC RHINITIS 10/07/2008  . GERD 10/07/2008  . HYPERLIPIDEMIA 10/07/2008  . COLONIC POLYPS, HX OF 10/07/2008  . Hypertriglyceridemia 07/12/2011  . Headache(784.0)     sinus  . Mood swings    Past Surgical History  Procedure Laterality Date  . Right ear/neck surgury  1997  . Eye surgery      LASIK    . Shoulder arthroscopy with subacromial decompression, rotator cuff repair and bicep tendon repair Left 12/18/2013    Procedure: LEFT SHOULDER ARTHROSCOPY WITH SUBACROMIAL DECOMPRESSION,DISTAL CLAVICAL RESECTION ROTATOR CUFF REPAIR ;  Surgeon: Marin Shutter, MD;  Location: [facility];  Service: Orthopedics;  Laterality: Left;    reports that he has never smoked. He does not have any smokeless tobacco history on file. He reports that he drinks about 0.6 oz of alcohol per week. He reports that he does not use illicit drugs. family history includes Cancer in his father. Allergies  Allergen Reactions  . Penicillins     REACTION: rash   Current Outpatient Prescriptions on File Prior to Visit  Medication Sig Dispense Refill  . clarithromycin (BIAXIN) 500 MG tablet Take 1 tablet (500 mg total) by mouth 2 (two) times daily. (Patient not taking: Reported on 10/28/2014) 20 tablet 0   No current facility-administered medications on file prior to visit.   Review of Systems Constitutional: Negative for increased diaphoresis, other activity, appetite or siginficant weight change other than noted HENT: Negative for worsening hearing loss, ear pain, facial swelling, mouth sores and neck stiffness.   Eyes: Negative for other worsening pain, redness or visual disturbance.  Respiratory: Negative for shortness of breath and wheezing  Cardiovascular: Negative for chest pain and palpitations.  Gastrointestinal: Negative for diarrhea, blood in stool, abdominal distention or other pain Genitourinary: Negative for hematuria, flank pain or change in urine volume.  Musculoskeletal: Negative for myalgias or other joint complaints.  Skin: Negative for color change and wound or drainage.  Neurological: Negative for syncope and numbness. other than noted Hematological: Negative for adenopathy. or other swelling Psychiatric/Behavioral: Negative for hallucinations, SI, self-injury, decreased concentration or other worsening agitation.      Objective:   Physical Exam BP 110/64 mmHg  Pulse 79  Temp(Src) 97.7 F (36.5 C) (Oral)  Ht 6' (1.829 m)  Wt 200 lb (90.719 kg)  BMI 27.12 kg/m2  SpO2 95% VS noted,  Constitutional: Pt is oriented to person, place, and time. Appears well-developed and well-nourished, in no significant distress Head:  Normocephalic and atraumatic.  Right Ear: External ear normal.  Left Ear: External ear normal.  Nose: Nose normal.  Mouth/Throat: Oropharynx is clear and moist.  Eyes: Conjunctivae and EOM are normal. Pupils are equal, round, and reactive to light.  Neck: Normal range of motion. Neck supple. No JVD present. No tracheal deviation present or significant neck LA or mass Cardiovascular: Normal rate, regular rhythm, normal heart sounds and intact distal pulses.   Pulmonary/Chest: Effort normal and breath sounds without rales or wheezing  Abdominal: Soft. Bowel sounds are normal. NT. No HSM  Musculoskeletal: Normal range of motion. Exhibits no edema.  Lymphadenopathy:  Has no cervical adenopathy.  Neurological: Pt is alert and oriented to person, place, and time. Pt has normal reflexes. No cranial nerve deficit. Motor grossly intact Skin: Skin is warm and dry. No rash noted.  Psychiatric:  Has nervous mood and affect. Behavior is normal.     Assessment & Plan:

## 2014-10-28 NOTE — Progress Notes (Signed)
Pre visit review using our clinic review tool, if applicable. No additional management support is needed unless otherwise documented below in the visit note. 

## 2014-10-29 ENCOUNTER — Telehealth: Payer: Self-pay

## 2014-10-29 MED ORDER — FLUTICASONE PROPIONATE 50 MCG/ACT NA SUSP: 2.0000 | Freq: Every day | NASAL | Status: DC

## 2014-10-29 NOTE — Telephone Encounter (Signed)
Ok done erx 

## 2014-10-29 NOTE — Telephone Encounter (Signed)
Pharmacy states that Nasacort is no longer manufactured. They are requesting new Rx for generic flonase, okay to change?

## 2014-10-29 NOTE — Addendum Note (Signed)
Addended by: Biagio Borg on: 10/29/2014 06:49 PM   Modules accepted: Orders, SmartSet

## 2014-11-05 ENCOUNTER — Other Ambulatory Visit: Payer: Self-pay

## 2014-12-25 ENCOUNTER — Other Ambulatory Visit: Payer: Self-pay | Admitting: Otolaryngology

## 2014-12-25 DIAGNOSIS — J324 Chronic pansinusitis: Secondary | ICD-10-CM

## 2014-12-28 ENCOUNTER — Ambulatory Visit
Admission: RE | Admit: 2014-12-28 | Discharge: 2014-12-28 | Disposition: A | Discharge status: Home / Self Care | Payer: 59 | Source: Ambulatory Visit | Attending: Otolaryngology | Admitting: Otolaryngology

## 2014-12-28 DIAGNOSIS — J324 Chronic pansinusitis: Secondary | ICD-10-CM

## 2015-08-02 ENCOUNTER — Other Ambulatory Visit: Payer: Self-pay | Admitting: *Deleted

## 2015-08-02 MED ORDER — FENOFIBRATE 160 MG PO TABS: 160.0000 mg | ORAL_TABLET | Freq: Every day | ORAL | Status: DC

## 2015-08-02 MED ORDER — OMEPRAZOLE 20 MG PO CPDR: 20.0000 mg | DELAYED_RELEASE_CAPSULE | Freq: Every day | ORAL | Status: DC

## 2015-08-02 NOTE — Telephone Encounter (Signed)
Received call pt states he is [facility] Verified which med he is needing inform sending [facility]ate [facility]...Johny Chess

## 2015-10-28 ENCOUNTER — Ambulatory Visit (INDEPENDENT_AMBULATORY_CARE_PROVIDER_SITE_OTHER): Payer: Managed Care, Other (non HMO) | Admitting: Internal Medicine

## 2015-10-28 ENCOUNTER — Other Ambulatory Visit (INDEPENDENT_AMBULATORY_CARE_PROVIDER_SITE_OTHER): Payer: Managed Care, Other (non HMO)

## 2015-10-28 ENCOUNTER — Telehealth: Payer: Self-pay

## 2015-10-28 ENCOUNTER — Encounter: Payer: Self-pay | Admitting: Internal Medicine

## 2015-10-28 VITALS — BP 122/68 | HR 73 | Temp 98.1°F | Resp 20 | Wt 195.0 lb

## 2015-10-28 DIAGNOSIS — E785 Hyperlipidemia, unspecified: Secondary | ICD-10-CM

## 2015-10-28 DIAGNOSIS — Z0001 Encounter for general adult medical examination with abnormal findings: Secondary | ICD-10-CM

## 2015-10-28 DIAGNOSIS — Z1159 Encounter for screening for other viral diseases: Secondary | ICD-10-CM

## 2015-10-28 DIAGNOSIS — M549 Dorsalgia, unspecified: Secondary | ICD-10-CM | POA: Insufficient documentation

## 2015-10-28 DIAGNOSIS — R6889 Other general symptoms and signs: Secondary | ICD-10-CM

## 2015-10-28 DIAGNOSIS — M545 Low back pain, unspecified: Secondary | ICD-10-CM | POA: Insufficient documentation

## 2015-10-28 LAB — CBC WITH DIFFERENTIAL/PLATELET
Basophils Absolute: 0 10*3/uL (ref 0.0–0.1)
Basophils Relative: 0.4 % (ref 0.0–3.0)
EOS ABS: 0.1 10*3/uL (ref 0.0–0.7)
EOS PCT: 1.6 % (ref 0.0–5.0)
HCT: 43.3 % (ref 39.0–52.0)
HEMOGLOBIN: 14.6 g/dL (ref 13.0–17.0)
LYMPHS PCT: 31.5 % (ref 12.0–46.0)
Lymphs Abs: 1.6 10*3/uL (ref 0.7–4.0)
MCHC: 33.8 g/dL (ref 30.0–36.0)
MCV: 86.6 fl (ref 78.0–100.0)
MONO ABS: 0.3 10*3/uL (ref 0.1–1.0)
MONOS PCT: 6.6 % (ref 3.0–12.0)
Neutro Abs: 3.1 10*3/uL (ref 1.4–7.7)
Neutrophils Relative %: 59.9 % (ref 43.0–77.0)
Platelets: 159 10*3/uL (ref 150.0–400.0)
RBC: 5 Mil/uL (ref 4.22–5.81)
RDW: 12.6 % (ref 11.5–15.5)
WBC: 5.2 10*3/uL (ref 4.0–10.5)

## 2015-10-28 LAB — URINALYSIS, ROUTINE W REFLEX MICROSCOPIC
Bilirubin Urine: NEGATIVE
Hgb urine dipstick: NEGATIVE
KETONES UR: NEGATIVE
LEUKOCYTES UA: NEGATIVE
NITRITE: NEGATIVE
PH: 5.5 (ref 5.0–8.0)
SPECIFIC GRAVITY, URINE: 1.025 (ref 1.000–1.030)
Total Protein, Urine: NEGATIVE
URINE GLUCOSE: NEGATIVE
UROBILINOGEN UA: 0.2 (ref 0.0–1.0)

## 2015-10-28 LAB — LIPID PANEL
CHOLESTEROL: 164 mg/dL (ref 0–200)
HDL: 48.7 mg/dL (ref >39.00)
LDL Cholesterol: 81 mg/dL (ref 0–99)
NonHDL: 114.98
Total CHOL/HDL Ratio: 3
Triglycerides: 171 mg/dL — ABNORMAL HIGH (ref 0.0–149.0)
VLDL: 34.2 mg/dL (ref 0.0–40.0)

## 2015-10-28 LAB — HEPATIC FUNCTION PANEL
ALBUMIN: 4.8 g/dL (ref 3.5–5.2)
ALK PHOS: 49 U/L (ref 39–117)
ALT: 19 U/L (ref 0–53)
AST: 16 U/L (ref 0–37)
Bilirubin, Direct: 0.1 mg/dL (ref 0.0–0.3)
Total Bilirubin: 0.4 mg/dL (ref 0.2–1.2)
Total Protein: 7.6 g/dL (ref 6.0–8.3)

## 2015-10-28 LAB — BASIC METABOLIC PANEL
BUN: 16 mg/dL (ref 6–23)
CALCIUM: 10.1 mg/dL (ref 8.4–10.5)
CO2: 29 mEq/L (ref 19–32)
CREATININE: 1.17 mg/dL (ref 0.40–1.50)
Chloride: 106 mEq/L (ref 96–112)
GFR: 69.09 mL/min (ref >60.00)
GLUCOSE: 99 mg/dL (ref 70–99)
Potassium: 4.1 mEq/L (ref 3.5–5.1)
SODIUM: 141 meq/L (ref 135–145)

## 2015-10-28 LAB — PSA: PSA: 0.52 ng/mL (ref 0.10–4.00)

## 2015-10-28 LAB — TSH: TSH: 1.26 u[IU]/mL (ref 0.35–4.50)

## 2015-10-28 LAB — HEPATITIS C ANTIBODY: HCV Ab: NEGATIVE

## 2015-10-28 MED ORDER — OMEPRAZOLE 20 MG PO CPDR: 20.0000 mg | DELAYED_RELEASE_CAPSULE | Freq: Every day | ORAL | Status: DC

## 2015-10-28 MED ORDER — IBUPROFEN 800 MG PO TABS: 800.0000 mg | ORAL_TABLET | Freq: Three times a day (TID) | ORAL | Status: DC | PRN

## 2015-10-28 MED ORDER — ASPIRIN EC 81 MG PO TBEC: 81.0000 mg | DELAYED_RELEASE_TABLET | Freq: Every day | ORAL | Status: DC

## 2015-10-28 MED ORDER — CYCLOBENZAPRINE HCL 5 MG PO TABS: 5.0000 mg | ORAL_TABLET | Freq: Three times a day (TID) | ORAL | Status: DC | PRN

## 2015-10-28 MED ORDER — FENOFIBRATE 160 MG PO TABS: 160.0000 mg | ORAL_TABLET | Freq: Every day | ORAL | Status: DC

## 2015-10-28 NOTE — Telephone Encounter (Signed)
A user error has taken place.

## 2015-10-28 NOTE — Patient Instructions (Signed)
Please take all new medication as prescribed - the pain medication, and the muscle relaxer as needed  ALSO:  Please start Aspirin 81 mg - 1 per day (enteric coated only) EVERY day to reduce your risk of heart disease and stroke  Please continue all other medications as before, and refills have been done if requested.  Please have the pharmacy call with any other refills you may need.  Please continue your efforts at being more active, low cholesterol diet, and weight control.  You are otherwise up to date with prevention measures today.  Please keep your appointments with your specialists as you may have planned  Please go to the LAB in the Basement (turn left off the elevator) for the tests to be done today  You will be contacted by phone if any changes need to be made immediately.  Otherwise, you will receive a letter about your results with an explanation, but please check with MyChart first.  Please remember to sign up for MyChart if you have not done so, as this will be important to you in the future with finding out test results, communicating by private email, and scheduling acute appointments online when needed.  Please return in 1 year for your yearly visit, or sooner if needed, with Lab testing done 3-5 days before

## 2015-10-28 NOTE — Progress Notes (Signed)
Pre visit review using our clinic review tool, if applicable. No additional management support is needed unless otherwise documented below in the visit note. 

## 2015-10-28 NOTE — Assessment & Plan Note (Signed)

## 2015-10-28 NOTE — Progress Notes (Signed)
Subjective:    Patient ID: Jonathan Mills, male    DOB: January 11, 1961, 55 y.o.   MRN: [id number]  HPI  Here for wellness and f/u;  Overall doing ok;  Pt denies Chest pain, worsening SOB, DOE, wheezing, orthopnea, PND, worsening LE edema, palpitations, dizziness or syncope.  Pt denies neurological change such as new headache, facial or extremity weakness.  Pt denies polydipsia, polyuria, or low sugar symptoms. Pt states overall good compliance with treatment and medications, good tolerability, and has been trying to follow appropriate diet.  Pt denies worsening depressive symptoms, suicidal ideation or panic. No fever, night sweats, wt loss, loss of appetite, or other constitutional symptoms.  Pt states good ability with ADL's, has low fall risk, home safety reviewed and adequate, no other significant changes in hearing or vision, and only occasionally active with exercise.  Also here with lower back pain onset after moving mattress upstairs 11 days ago, did not have immed pain but for several days had some right lower back aching that improved after a few days, then 1 wk ago moved a heavy endtable and had immed flare of mod to severe right lower back pain (not midline or left) but seems to radaite to the right lateral abd and rlq. Tender to touch. And some worse to twist and bend, no radiation to buttock and but no bowel or bladder change, fever, wt loss,  worsening LE pain/numbness/weakness, gait change or falls. No Leg giveaway or falls, but can liimp at times. Past Medical History  Diagnosis Date  . Vitamin D deficiency 07/12/2011  . ALLERGIC RHINITIS 10/07/2008  . GERD 10/07/2008  . HYPERLIPIDEMIA 10/07/2008  . COLONIC POLYPS, HX OF 10/07/2008  . Hypertriglyceridemia 07/12/2011  . Headache(784.0)     sinus  . Mood swings ([place])    Past Surgical History  Procedure Laterality Date  . Right ear/neck surgury  1997  . Eye surgery      LASIK    . Shoulder arthroscopy with subacromial decompression, rotator  cuff repair and bicep tendon repair Left 12/18/2013    Procedure: LEFT SHOULDER ARTHROSCOPY WITH SUBACROMIAL DECOMPRESSION,DISTAL CLAVICAL RESECTION ROTATOR CUFF REPAIR ;  Surgeon: Marin Shutter, MD;  Location: [facility];  Service: Orthopedics;  Laterality: Left;    reports that he has never smoked. He does not have any smokeless tobacco history on file. He reports that he drinks about 0.6 oz of alcohol per week. He reports that he does not use illicit drugs. family history includes Cancer in his father. Allergies  Allergen Reactions  . Penicillins     REACTION: rash   Current Outpatient Prescriptions on File Prior to Visit  Medication Sig Dispense Refill  . fenofibrate 160 MG tablet Take 1 tablet (160 mg total) by mouth daily. 90 tablet 1  . omeprazole (PRILOSEC) 20 MG capsule Take 1 capsule (20 mg total) by mouth daily. 90 capsule 1   No current facility-administered medications on file prior to visit.   Review of Systems Constitutional: Negative for increased diaphoresis, or other activity, appetite or siginficant weight change other than noted HENT: Negative for worsening hearing loss, ear pain, facial swelling, mouth sores and neck stiffness.   Eyes: Negative for other worsening pain, redness or visual disturbance.  Respiratory: Negative for choking or stridor Cardiovascular: Negative for other chest pain and palpitations.  Gastrointestinal: Negative for worsening diarrhea, blood in stool, or abdominal distention Genitourinary: Negative for hematuria, flank pain or change in urine volume.  Musculoskeletal: Negative for myalgias  or other joint complaints.  Skin: Negative for other color change and wound or drainage.  Neurological: Negative for syncope and numbness. other than noted Hematological: Negative for adenopathy. or other swelling Psychiatric/Behavioral: Negative for hallucinations, SI, self-injury, decreased concentration or other worsening agitation.      Objective:    Physical Exam BP 122/68 mmHg  Pulse 73  Temp(Src) 98.1 F (36.7 C) (Oral)  Resp 20  Wt 195 lb (88.451 kg)  SpO2 97% VS noted,  Constitutional: Pt is oriented to person, place, and time. Appears well-developed and well-nourished, in no significant distress Head: Normocephalic and atraumatic  Eyes: Conjunctivae and EOM are normal. Pupils are equal, round, and reactive to light Right Ear: External ear normal.  Left Ear: External ear normal Nose: Nose normal.  Mouth/Throat: Oropharynx is clear and moist  Neck: Normal range of motion. Neck supple. No JVD present. No tracheal deviation present or significant neck LA or mass Cardiovascular: Normal rate, regular rhythm, normal heart sounds and intact distal pulses.   Pulmonary/Chest: Effort normal and breath sounds without rales or wheezing  Abdominal: Soft. Bowel sounds are normal. NT. No HSM  Musculoskeletal: Normal range of motion. Exhibits no edema Lymphadenopathy: Has no cervical adenopathy.  Spine nontender Marked right lumbar paravertebral tender noted with spasm, no rash or swelling Neurological: Pt is alert and oriented to person, place, and time. Pt has normal reflexes. No cranial nerve deficit. Motor grossly intact Skin: Skin is warm and dry. No rash noted or new ulcers Psychiatric:  Has normal mood and affect. Behavior is normal.      Assessment & Plan:

## 2015-10-31 NOTE — Assessment & Plan Note (Addendum)
stable overall by history and exam, recent data reviewed with pt, and pt to continue medical treatment as before,  to f/u any worsening symptoms or concerns Lab Results  Component Value Date   LDLCALC 81 10/28/2015   To also start asa 81 qd

## 2015-10-31 NOTE — Assessment & Plan Note (Addendum)
Mild to mod acute, liekly c/w msk strain, for pain control and muscle relaxer asd,  to f/u any worsening symptoms or concerns  In addition to the time spent performing CPE, I spent an additional 15 minutes face to face,in which greater than 50% of this time was spent in counseling and coordination of care for patient's illness as documented.

## 2016-10-13 ENCOUNTER — Ambulatory Visit (INDEPENDENT_AMBULATORY_CARE_PROVIDER_SITE_OTHER): Payer: 59 | Admitting: Internal Medicine

## 2016-10-13 ENCOUNTER — Encounter: Payer: Self-pay | Admitting: Internal Medicine

## 2016-10-13 ENCOUNTER — Other Ambulatory Visit (INDEPENDENT_AMBULATORY_CARE_PROVIDER_SITE_OTHER): Payer: Managed Care, Other (non HMO)

## 2016-10-13 VITALS — BP 124/82 | HR 65 | Ht 72.0 in | Wt 198.0 lb

## 2016-10-13 DIAGNOSIS — L989 Disorder of the skin and subcutaneous tissue, unspecified: Secondary | ICD-10-CM | POA: Diagnosis not present

## 2016-10-13 DIAGNOSIS — Z0001 Encounter for general adult medical examination with abnormal findings: Secondary | ICD-10-CM | POA: Diagnosis not present

## 2016-10-13 DIAGNOSIS — R51 Headache: Secondary | ICD-10-CM

## 2016-10-13 DIAGNOSIS — F419 Anxiety disorder, unspecified: Secondary | ICD-10-CM | POA: Diagnosis not present

## 2016-10-13 DIAGNOSIS — R519 Headache, unspecified: Secondary | ICD-10-CM | POA: Insufficient documentation

## 2016-10-13 LAB — CBC WITH DIFFERENTIAL/PLATELET
Basophils Absolute: 0 10*3/uL (ref 0.0–0.1)
Basophils Relative: 0.4 % (ref 0.0–3.0)
EOS PCT: 1.5 % (ref 0.0–5.0)
Eosinophils Absolute: 0.1 10*3/uL (ref 0.0–0.7)
HCT: 41.9 % (ref 39.0–52.0)
Hemoglobin: 14 g/dL (ref 13.0–17.0)
LYMPHS ABS: 1.7 10*3/uL (ref 0.7–4.0)
Lymphocytes Relative: 37.8 % (ref 12.0–46.0)
MCHC: 33.3 g/dL (ref 30.0–36.0)
MCV: 87.7 fl (ref 78.0–100.0)
MONOS PCT: 8.2 % (ref 3.0–12.0)
Monocytes Absolute: 0.4 10*3/uL (ref 0.1–1.0)
NEUTROS ABS: 2.4 10*3/uL (ref 1.4–7.7)
NEUTROS PCT: 52.1 % (ref 43.0–77.0)
PLATELETS: 160 10*3/uL (ref 150.0–400.0)
RBC: 4.78 Mil/uL (ref 4.22–5.81)
RDW: 12.5 % (ref 11.5–15.5)
WBC: 4.6 10*3/uL (ref 4.0–10.5)

## 2016-10-13 LAB — TSH: TSH: 1.45 u[IU]/mL (ref 0.35–4.50)

## 2016-10-13 LAB — LIPID PANEL
Cholesterol: 174 mg/dL (ref 0–200)
HDL: 44.9 mg/dL (ref >39.00)
LDL Cholesterol: 96 mg/dL (ref 0–99)
NONHDL: 128.63
Total CHOL/HDL Ratio: 4
Triglycerides: 161 mg/dL — ABNORMAL HIGH (ref 0.0–149.0)
VLDL: 32.2 mg/dL (ref 0.0–40.0)

## 2016-10-13 LAB — BASIC METABOLIC PANEL
BUN: 17 mg/dL (ref 6–23)
CALCIUM: 9.8 mg/dL (ref 8.4–10.5)
CO2: 28 meq/L (ref 19–32)
Chloride: 105 mEq/L (ref 96–112)
Creatinine, Ser: 1.16 mg/dL (ref 0.40–1.50)
GFR: 69.53 mL/min (ref >60.00)
Glucose, Bld: 84 mg/dL (ref 70–99)
Potassium: 4 mEq/L (ref 3.5–5.1)
SODIUM: 140 meq/L (ref 135–145)

## 2016-10-13 LAB — HEPATIC FUNCTION PANEL
ALK PHOS: 48 U/L (ref 39–117)
ALT: 27 U/L (ref 0–53)
AST: 21 U/L (ref 0–37)
Albumin: 4.9 g/dL (ref 3.5–5.2)
BILIRUBIN DIRECT: 0.1 mg/dL (ref 0.0–0.3)
TOTAL PROTEIN: 7.5 g/dL (ref 6.0–8.3)
Total Bilirubin: 0.6 mg/dL (ref 0.2–1.2)

## 2016-10-13 LAB — URINALYSIS, ROUTINE W REFLEX MICROSCOPIC
BILIRUBIN URINE: NEGATIVE
Hgb urine dipstick: NEGATIVE
KETONES UR: NEGATIVE
Leukocytes, UA: NEGATIVE
Nitrite: NEGATIVE
PH: 6 (ref 5.0–8.0)
RBC / HPF: NONE SEEN (ref 0–?)
Specific Gravity, Urine: <=1.005 — AB (ref 1.000–1.030)
TOTAL PROTEIN, URINE-UPE24: NEGATIVE
URINE GLUCOSE: NEGATIVE
UROBILINOGEN UA: 0.2 (ref 0.0–1.0)
WBC UA: NONE SEEN (ref 0–?)

## 2016-10-13 LAB — PSA: PSA: 0.53 ng/mL (ref 0.10–4.00)

## 2016-10-13 MED ORDER — SUMATRIPTAN SUCCINATE 50 MG PO TABS: 50.0000 mg | ORAL_TABLET | ORAL | 11 refills | Status: DC | PRN

## 2016-10-13 MED ORDER — SUMATRIPTAN SUCCINATE 50 MG PO TABS: 50.0000 mg | ORAL_TABLET | ORAL | 0 refills | Status: DC | PRN

## 2016-10-13 NOTE — Patient Instructions (Signed)
Please take all new medication as prescribed - the imitrex for headache if needed  You will be contacted regarding the referral for: Neurology  Please continue all other medications as before, and refills have been done if requested.  Please have the pharmacy call with any other refills you may need.  Please continue your efforts at being more active, low cholesterol diet, and weight control.  You are otherwise up to date with prevention measures today.  Please keep your appointments with your specialists as you may have planned  Please go to the LAB in the Basement (turn left off the elevator) for the tests to be done today  You will be contacted by phone if any changes need to be made immediately.  Otherwise, you will receive a letter about your results with an explanation, but please check with MyChart first.  Please remember to sign up for MyChart if you have not done so, as this will be important to you in the future with finding out test results, communicating by private email, and scheduling acute appointments online when needed.  Please return in 1 year for your yearly visit, or sooner if needed, with Lab testing done 3-5 days before

## 2016-10-13 NOTE — Progress Notes (Signed)
Subjective:    Patient ID: Jonathan Mills, male    DOB: 23-Oct-1960, 56 y.o.   MRN: [id number]  HPI  Here for wellness and f/u;  Overall doing ok;  Pt denies Chest pain, worsening SOB, DOE, wheezing, orthopnea, PND, worsening LE edema, palpitations, dizziness or syncope.  Pt denies neurological change such as new headache, facial or extremity weakness.  Pt denies polydipsia, polyuria, or low sugar symptoms. Pt states overall good compliance with treatment and medications, good tolerability, and has been trying to follow appropriate diet.  Pt denies worsening depressive symptoms, suicidal ideation or panic. No fever, night sweats, wt loss, loss of appetite, or other constitutional symptoms.  Pt states good ability with ADL's, has low fall risk, home safety reviewed and adequate, no other significant changes in hearing or vision, and not active with exercise due to other hx.  Due for colonoscopy, he states he will call Dr Cristina Gong office himself  Also here with HA issues, started about jan 2016 with 6 wks jan/feb that year with dail HA with large tylenol use. Saw ENT with sinus surgury but no help with HA. In time has been less freq, but adapted to it though not less severe when occurred.  Gets sleepy (not really fatigued "all over" ) but could fall asleep any time. Winter 2016 did see Dr Melton Alar locally regarding concerns about possible future dementia as is prominent in the family (not for HA's) - told to relax and lifestyle facotrs to improved.  Most of 2017 was a good year with few HA but toleratble with otc, but now again having more HA since feb 2018 now at least weekly, more than mod occas severe, intermittent usually last 3-4 days but controlled with tylenol.with reducing but not resolving HA.  No known lifestyle triggers.  For some reason this year assoc with ST.  Is mouth breather , and sleep study approx 15 yrs ago neg, wt overall about the same Wt Readings from Last 3 Encounters:  10/13/16 198 lb (89.8  kg)  10/28/15 195 lb (88.5 kg)  10/28/14 200 lb (90.7 kg)  HA's are located "mostly all over " but focused on the frontal aspect. Not throbbing, has floaters but no other visioin change except for glasses, Does have occas nausea, no vomiting.  Denies photophobia but does get some improvement with going to a dark room.  Ears ring with HA but no hyuperacusis.  Has hx of HA's back to his teens.  Mother with hx of migraines.   No HA today.  Is currently divorced with daughter every other weekend but was stuck with HA the past weekend and does not want that when has so little time with her.   Past Medical History:  Diagnosis Date  . ALLERGIC RHINITIS 10/07/2008  . COLONIC POLYPS, HX OF 10/07/2008  . GERD 10/07/2008  . Headache(784.0)    sinus  . HYPERLIPIDEMIA 10/07/2008  . Hypertriglyceridemia 07/12/2011  . Mood swings ([place])   . Vitamin D deficiency 07/12/2011   Past Surgical History:  Procedure Laterality Date  . EYE SURGERY     LASIK    . right ear/neck surgury  1997  . SHOULDER ARTHROSCOPY WITH SUBACROMIAL DECOMPRESSION, ROTATOR CUFF REPAIR AND BICEP TENDON REPAIR Left 12/18/2013   Procedure: LEFT SHOULDER ARTHROSCOPY WITH SUBACROMIAL DECOMPRESSION,DISTAL CLAVICAL RESECTION ROTATOR CUFF REPAIR ;  Surgeon: Marin Shutter, MD;  Location: [facility];  Service: Orthopedics;  Laterality: Left;    reports that he has never smoked. He has  never used smokeless tobacco. He reports that he drinks about 0.6 oz of alcohol per week . He reports that he does not use drugs. family history includes Cancer in his father. Allergies  Allergen Reactions  . Penicillins     REACTION: rash   Current Outpatient Prescriptions on File Prior to Visit  Medication Sig Dispense Refill  . fenofibrate 160 MG tablet Take 1 tablet (160 mg total) by mouth daily. 90 tablet 3  . omeprazole (PRILOSEC) 20 MG capsule Take 1 capsule (20 mg total) by mouth daily. 90 capsule 3   No current facility-administered medications on file prior  to visit.    Review of Systems Constitutional: Negative for other unusual diaphoresis, sweats, appetite or weight changes HENT: Negative for other worsening hearing loss, ear pain, facial swelling, mouth sores or neck stiffness.   Eyes: Negative for other worsening pain, redness or other visual disturbance.  Respiratory: Negative for other stridor or swelling Cardiovascular: Negative for other palpitations or other chest pain  Gastrointestinal: Negative for worsening diarrhea or loose stools, blood in stool, distention or other pain Genitourinary: Negative for hematuria, flank pain or other change in urine volume.  Musculoskeletal: Negative for myalgias or other joint swelling.  Skin: Negative for other color change, or other wound or worsening drainage.  Neurological: Negative for other syncope or numbness. Hematological: Negative for other adenopathy or swelling Psychiatric/Behavioral: Negative for hallucinations, other worsening agitation, SI, self-injury, or new decreased concentration All other system neg per pt    Objective:   Physical Exam BP 124/82   Pulse 65   Ht 6' (1.829 m)   Wt 198 lb (89.8 kg)   SpO2 99%   BMI 26.85 kg/m  VS noted,  Constitutional: Pt is oriented to person, place, and time. Appears well-developed and well-nourished, in no significant distress and comfortable Head: Normocephalic and atraumatic  Eyes: Conjunctivae and EOM are normal. Pupils are equal, round, and reactive to light Right Ear: External ear normal without discharge Left Ear: External ear normal without discharge Nose: Nose without discharge or deformity Mouth/Throat: Oropharynx is without other ulcerations and moist  Neck: Normal range of motion. Neck supple. No JVD present. No tracheal deviation present or significant neck LA or mass Cardiovascular: Normal rate, regular rhythm, normal heart sounds and intact distal pulses.   Pulmonary/Chest: WOB normal and breath sounds without rales or  wheezing  Abdominal: Soft. Bowel sounds are normal. NT. No HSM  Musculoskeletal: Normal range of motion. Exhibits no edema Lymphadenopathy: Has no other cervical adenopathy.  Neurological: Pt is alert and oriented to person, place, and time. Pt has normal reflexes. No cranial nerve deficit. Motor grossly intact, Gait intact Scrotum; minute x3 erythema to right, and 1 x 3 mm erythem lesion to left  - ? Due to scratching Skin: Skin is warm and dry. No rash noted or new ulcerations Psychiatric:  Has nervous irritable mood and affect. Behavior is normal without agitation No other exam findings    Assessment & Plan:

## 2016-10-15 DIAGNOSIS — L989 Disorder of the skin and subcutaneous tissue, unspecified: Secondary | ICD-10-CM | POA: Insufficient documentation

## 2016-10-15 NOTE — Assessment & Plan Note (Addendum)
C/w cluster vs migraine vs other, for imitrex trial, and neurology referral  In addition to the time spent performing CPE, I spent an additional 25 minutes face to face,in which greater than 50% of this time was spent in counseling and coordination of care for patient's acute illness as documented, including the differential diagnosis, evaluation and management of Headache, anxiety and scrotal skin lesions

## 2016-10-15 NOTE — Assessment & Plan Note (Signed)

## 2016-10-15 NOTE — Assessment & Plan Note (Signed)
D/w pt, declines further tx at this time

## 2016-10-15 NOTE — Assessment & Plan Note (Signed)
With very small erythem lesions to scrotum likely traumatic from scratching though he's not sure how this happened, ok to follow

## 2016-10-18 ENCOUNTER — Encounter: Payer: Self-pay | Admitting: Neurology

## 2016-11-06 ENCOUNTER — Other Ambulatory Visit: Payer: Self-pay | Admitting: Internal Medicine

## 2016-12-12 ENCOUNTER — Other Ambulatory Visit: Payer: Self-pay | Admitting: Internal Medicine

## 2016-12-20 ENCOUNTER — Encounter: Payer: Self-pay | Admitting: Neurology

## 2016-12-25 ENCOUNTER — Ambulatory Visit: Payer: Managed Care, Other (non HMO) | Admitting: Neurology

## 2017-01-19 ENCOUNTER — Ambulatory Visit (INDEPENDENT_AMBULATORY_CARE_PROVIDER_SITE_OTHER): Payer: 59 | Admitting: Neurology

## 2017-01-19 ENCOUNTER — Encounter: Payer: Self-pay | Admitting: Neurology

## 2017-01-19 VITALS — BP 112/76 | HR 60 | Ht 72.0 in | Wt 195.9 lb

## 2017-01-19 DIAGNOSIS — G43019 Migraine without aura, intractable, without status migrainosus: Secondary | ICD-10-CM

## 2017-01-19 DIAGNOSIS — R413 Other amnesia: Secondary | ICD-10-CM

## 2017-01-19 DIAGNOSIS — Z820 Family history of epilepsy and other diseases of the nervous system: Secondary | ICD-10-CM | POA: Diagnosis not present

## 2017-01-19 MED ORDER — NORTRIPTYLINE HCL 10 MG PO CAPS: 10.0000 mg | ORAL_CAPSULE | Freq: Every day | ORAL | 3 refills | Status: DC

## 2017-01-19 MED ORDER — SUMATRIPTAN SUCCINATE 100 MG PO TABS: ORAL_TABLET | ORAL | 2 refills | Status: DC

## 2017-01-19 NOTE — Patient Instructions (Signed)
Migraine Recommendations: 1.  Start nortriptyline 10mg  at bedtime.  Call in 4 weeks with update and we can adjust dose if needed. 2.  Take sumatriptan 100mg  at earliest onset of headache.  May repeat dose once in 2 hours if needed.  Do not exceed two tablets in 24 hours. 3.  Limit use of pain relievers to no more than 2 days out of the week.  These medications include acetaminophen, ibuprofen, triptans and narcotics.  This will help reduce risk of rebound headaches. 4.  Be aware of common food triggers such as processed sweets, processed foods with nitrites (such as deli meat, hot dogs, sausages), foods with MSG, alcohol (such as wine), chocolate, certain cheeses, certain fruits (dried fruits, some citrus fruit), vinegar, diet soda. 4.  Avoid caffeine 5.  Routine exercise 6.  Proper sleep hygiene 7.  Stay adequately hydrated with water 8.  Keep a headache diary. 9.  Maintain proper stress management. 10.  Do not skip meals. 11.  Consider supplements:  Magnesium citrate 400mg  to 600mg  daily, riboflavin 400mg , Coenzyme Q 10 100mg  three times daily 12.  For memory, we will order neurocognitive testing.   Migraine Headache A migraine headache is a very strong throbbing pain on one side or both sides of your head. Migraines can also cause other symptoms. Talk with your doctor about what things may bring on (trigger) your migraine headaches. Follow these instructions at home: Medicines  Take over-the-counter and prescription medicines only as told by your doctor.  Do not drive or use heavy machinery while taking prescription pain medicine.  To prevent or treat constipation while you are taking prescription pain medicine, your doctor may recommend that you: ? Drink enough fluid to keep your pee (urine) clear or pale yellow. ? Take over-the-counter or prescription medicines. ? Eat foods that are high in fiber. These include fresh fruits and vegetables, whole grains, and beans. ? Limit foods that  are high in fat and processed sugars. These include fried and sweet foods. Lifestyle  Avoid alcohol.  Do not use any products that contain nicotine or tobacco, such as cigarettes and e-cigarettes. If you need help quitting, ask your doctor.  Get at least 8 hours of sleep every night.  Limit your stress. General instructions   Keep a journal to find out what may bring on your migraines. For example, write down: ? What you eat and drink. ? How much sleep you get. ? Any change in what you eat or drink. ? Any change in your medicines.  If you have a migraine: ? Avoid things that make your symptoms worse, such as bright lights. ? It may help to lie down in a dark, quiet room. ? Do not drive or use heavy machinery. ? Ask your doctor what activities are safe for you.  Keep all follow-up visits as told by your doctor. This is important. Contact a doctor if:  You get a migraine that is different or worse than your usual migraines. Get help right away if:  Your migraine gets very bad.  You have a fever.  You have a stiff neck.  You have trouble seeing.  Your muscles feel weak or like you cannot control them.  You start to lose your balance a lot.  You start to have trouble walking.  You pass out (faint). This information is not intended to replace advice given to you by your health care provider. Make sure you discuss any questions you have with your health care provider.  Document Released: 02/08/2008 Document Revised: 11/19/2015 Document Reviewed: 10/18/2015 Elsevier Interactive Patient Education  2017 [employer].

## 2017-01-19 NOTE — Progress Notes (Signed)
NEUROLOGY CONSULTATION NOTE  HERNANDEZ LOSASSO MRN: [id number] DOB: 1960/05/28  Referring provider: Dr. Jenny Reichmann Primary care provider: Dr. Jenny Reichmann  Reason for consult:  Headache, memory problems  HISTORY OF PRESENT ILLNESS: Jonathan Mills is a 56  year old male who presents for headache.   I  MIGRAINE; II TENSION-TYPE Onset:  20s Location:  Unilateral Quality:  sharp Intensity:  9-10/10 Aura:  No Prodrome:  No Postdrome:  No Associated symptoms:  Nausea, photophobia, phonophobia, sometimes vomiting.  No visual disturbance.  She has not had any new worse headache of her life, waking up from sleep Duration:  2-3 days; Frequency:  1 to 2 times a month Frequency of abortive medication: 1-2 days a week (Tylenol for body aches) Triggers/exacerbating factors:  no Relieving factors:  no Activity:  Aggravates  He also has history of chronic tension-type headaches that have been resolved for several years.  Past NSAIDS:  Ibuprofen, naproxen Past analgesics:  no Past abortive triptans:  no Past muscle relaxants:  no Past anti-emetic:  no Past antihypertensive medications:  no Past antidepressant medications:  Lexapro Past anticonvulsant medications:  no Past vitamins/Herbal/Supplements:  no Other past therapies:  no  Current NSAIDS:  no Current analgesics:  Tylenol (for body aches) Current triptans:  sumatriptan 50mg  (helps reduce intensity after 2 hours but not aborts) Current anti-emetic:  no Current muscle relaxants:  no Current anti-anxiolytic:  no Current sleep aide:  no Current Antihypertensive medications:  no Current Antidepressant medications:  no Current Anticonvulsant medications:  lamotrigine 200mg  Current Vitamins/Herbal/Supplements:  no Current Antihistamines/Decongestants:  no Other therapy:  no  Caffeine:  3 to 4 cups coffee daily, Mt. Dew Alcohol:  occasionally Smoker:  no Diet:  Does not hydrate Exercise:  no Depression/anxiety:  depression Sleep hygiene:   variable Family history of headache:  Mom had migraines Other family history:  Father: brain cancer (possibly GBM), maternal grandfather: Alzheimer's dementia.  He is also concerned about his memory, which has progressively gotten worse since 2016.  He endorses short term memory deficits.  He forgets conversations after a couple of days.  He forgets names of familiar people and only recalls it when he walks away.  He needs to write things down to remind himself.  He is able to perform all ADLs.  He does not get disoriented when driving on familiar routes.  He is able to manage his finances and pay his bills.  He is concerned because his maternal grandfather had Alzheimer's beginning in his 60s.  10/13/16 LABS:  CBC with WBC 4.6, HGB 14, HCT 41.9 and PLT 160; BMP with Na 140, K 4, Cl 105, CO2 28, glucose 84, BUN 17 and Cr 1.16; hepatic panel with total bili 0.6, ALP 48, AST 21 and ALT 27.  PAST MEDICAL HISTORY: Past Medical History:  Diagnosis Date  . ALLERGIC RHINITIS 10/07/2008  . COLONIC POLYPS, HX OF 10/07/2008  . GERD 10/07/2008  . Headache(784.0)    sinus  . HYPERLIPIDEMIA 10/07/2008  . Hypertriglyceridemia 07/12/2011  . Mood swings ([place])   . Vitamin D deficiency 07/12/2011    PAST SURGICAL HISTORY: Past Surgical History:  Procedure Laterality Date  . EYE SURGERY     LASIK    . right ear/neck surgury  1997  . SHOULDER ARTHROSCOPY WITH SUBACROMIAL DECOMPRESSION, ROTATOR CUFF REPAIR AND BICEP TENDON REPAIR Left 12/18/2013   Procedure: LEFT SHOULDER ARTHROSCOPY WITH SUBACROMIAL DECOMPRESSION,DISTAL CLAVICAL RESECTION ROTATOR CUFF REPAIR ;  Surgeon: Marin Shutter, MD;  Location: [facility]  OR;  Service: Orthopedics;  Laterality: Left;    MEDICATIONS: Current Outpatient Prescriptions on File Prior to Visit  Medication Sig Dispense Refill  . fenofibrate 160 MG tablet TAKE 1 TABLET DAILY 90 tablet 3  . lamoTRIgine (LAMICTAL) 200 MG tablet Take 200 mg by mouth daily.    Marland Kitchen omeprazole (PRILOSEC) 20 MG  capsule TAKE 1 CAPSULE DAILY 90 capsule 3  . Triamcinolone Acetonide (NASACORT ALLERGY 24HR NA) Place into the nose.     No current facility-administered medications on file prior to visit.     ALLERGIES: Allergies  Allergen Reactions  . Penicillins     REACTION: rash    FAMILY HISTORY: Family History  Problem Relation Age of Onset  . Cancer Father     SOCIAL HISTORY: Social History   Social History  . Marital status: Single    Spouse name: N/A  . Number of children: N/A  . Years of education: BS   Occupational History  . IT    Social History Main Topics  . Smoking status: Never Smoker  . Smokeless tobacco: Never Used  . Alcohol use 0.6 oz/week    1 Cans of beer per week  . Drug use: No  . Sexual activity: Not on file   Other Topics Concern  . 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    REVIEW OF SYSTEMS: Constitutional: No fevers, chills, or sweats, no generalized fatigue, change in appetite Eyes: No visual changes, double vision, eye pain Ear, nose and throat: No hearing loss, ear pain, nasal congestion, sore throat Cardiovascular: No chest pain, palpitations Respiratory:  No shortness of breath at rest or with exertion, wheezes GastrointestinaI: No nausea, vomiting, diarrhea, abdominal pain, fecal incontinence Genitourinary:  No dysuria, urinary retention or frequency Musculoskeletal:  No neck pain, back pain Integumentary: No rash, pruritus, skin lesions Neurological: as above Psychiatric: No depression, insomnia, anxiety Endocrine: No palpitations, fatigue, diaphoresis, mood swings, change in appetite, change in weight, increased thirst Hematologic/Lymphatic:  No purpura, petechiae. Allergic/Immunologic: no itchy/runny eyes, nasal congestion, recent allergic reactions, rashes  PHYSICAL EXAM: Vitals:   01/19/17 1010  BP: 112/76  Pulse: 60  SpO2: 98%   General: No acute distress.   Patient appears well-groomed.  Head:  Normocephalic/atraumatic Eyes:  fundi examined but not visualized Neck: supple, no paraspinal tenderness, full range of motion Back: No paraspinal tenderness Heart: regular rate and rhythm Lungs: Clear to auscultation bilaterally. Vascular: No carotid bruits. Neurological Exam: Mental status: alert and oriented to person, place, and time, delayed recall poor, remote memory intact, fund of knowledge intact, attention and concentration intact, speech fluent and not dysarthric, language intact.  Montreal Cognitive Assessment  01/19/2017  Visuospatial/ Executive (0/5) 4  Naming (0/3) 3  Attention: Read list of digits (0/2) 2  Attention: Read list of letters (0/1) 1  Attention: Serial 7 subtraction starting at 100 (0/3) 3  Language: Repeat phrase (0/2) 1  Language : Fluency (0/1) 0  Abstraction (0/2) 2  Delayed Recall (0/5) 2  Orientation (0/6) 6  Total 24  Adjusted Score (based on education) 24   Cranial nerves: CN I: not tested CN II: pupils equal, round and reactive to light, visual fields intact CN III, IV, VI:  full range of motion, no nystagmus, no ptosis CN V: facial sensation intact CN VII: upper and lower face symmetric CN VIII: hearing intact CN IX, X: gag intact, uvula midline  CN XI: sternocleidomastoid and trapezius muscles intact CN XII: tongue midline Bulk & Tone: normal, no fasciculations. Motor:  5/5 throughout  Sensation: temperature and vibration sensation intact. Deep Tendon Reflexes:  2+ throughout, toes downgoing.  Finger to nose testing:  Without dysmetria.  Heel to shin:  Without dysmetria.  Gait:  Normal station and stride.  Able to turn and tandem walk. Romberg negative.  IMPRESSION: Intractable migraines Memory deficits   PLAN: 1.  Start nortriptyline 10mg  at bedtime and increase dose in 4 weeks if needed for migraine prevention 2.  Increase sumatriptan to 100mg  3.  Lifestyle modification:  Exercise, diet,  hydration, stop caffeine 4.  Consider Mg, riboflavin, coQ10 5.  Refer for neuropsychological testing 6.  Follow up in 3 months.  Thank you for allowing me to take part in the care of this patient.  Metta Clines, DO  CC: Cathlean Cower, MD

## 2017-01-23 ENCOUNTER — Ambulatory Visit (INDEPENDENT_AMBULATORY_CARE_PROVIDER_SITE_OTHER): Payer: 59 | Admitting: Psychology

## 2017-01-23 ENCOUNTER — Encounter: Payer: Self-pay | Admitting: Psychology

## 2017-01-23 DIAGNOSIS — Z820 Family history of epilepsy and other diseases of the nervous system: Secondary | ICD-10-CM

## 2017-01-23 DIAGNOSIS — F329 Major depressive disorder, single episode, unspecified: Secondary | ICD-10-CM

## 2017-01-23 DIAGNOSIS — F32A Depression, unspecified: Secondary | ICD-10-CM

## 2017-01-23 DIAGNOSIS — R413 Other amnesia: Secondary | ICD-10-CM

## 2017-01-23 DIAGNOSIS — Z808 Family history of malignant neoplasm of other organs or systems: Secondary | ICD-10-CM

## 2017-01-23 NOTE — Progress Notes (Signed)
NEUROPSYCHOLOGICAL INTERVIEW (CPT: [id number])  Name: Jonathan Mills Date of Birth: [birth year] Date of Interview: 01/23/2017  Reason for Referral:  Jonathan Mills is a 56 y.o. male who is referred for neuropsychological evaluation by Dr. Metta Clines of [facility] due to concerns about memory loss. This patient is unaccompanied in the office for today's visit.  History of Presenting Problem:  Jonathan Mills saw Dr. Tomi Likens for neurologic consultation on 01/19/2017 for headache and subjective memory complaints. MoCA was 24/30. At today's visit, the patient reports gradual onset and progressive worsening of short term memory problems over the past few years. This concerns him because he has a family history of Alzheimer's disease (maternal grandfather had AD beginning in his 58s) and brain cancer (father who died at 34yo).   Jonathan Mills reports that, for example, if he is in a meeting at work, he has to write down exactly what is expected of him after the meeting or else he will not remember. In a hallways conversation, someone may ask him to do something and then two days later they come back and say he never did it, and he will have no memory of the entire conversation. He also has difficulty retrieving names of people when he goes to introduce them to someone else.   Upon direct questioning, the patient reported the following with regard to current cognitive functioning:   Forgetting recent conversations/events: Yes Repeating statements/questions: No Misplacing/losing items: No Forgetting appointments or other obligations: No, but has had to start using very detailed notes and calendars and reminders Forgetting to take medications: Yes (forget morning meds probably once every two weeks, so has a spare set at work) Forgetting to pay bills: No  Difficulty concentrating: No Starting but not finishing tasks: No   Word-finding difficulty: Yes Writing difficulty: No Spelling difficulty: No Comprehension  difficulty: No  Making wrong turns when driving: "Navigation is not my strong suit", but never has been, this is not changed Uncertain about directions when driving or passenger: As above Getting lost when driving: No  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 He likes his work. He is single (divorced two years ago). He lives alone but has his 41yo daughter 50% of the time.  He has a long history of depression. He is a patient at [employer]. He was tried on several different SSRIs but he could not tolerate negative side effects. Because of this, and because his depression is characterized by mood swings, he was started on lamotrigine. He does feel the medication has helped to some extent but he still has mood swings. He state that throughout the day, he will be happy and then all of a sudden feel depressed. He has also experienced episodes of depression that last for several days, but it has never prevented him from going to work. He reports the main symptom of his depression is apathy. This has been present his whole life but worse since his divorce 2 years ago. He did participate in talk therapy at [facility] in the past and found this very helpful. He now sees psychiatry every 6 months for med management. He denies any recent suicidal ideation (last experienced this in his 2s or 62s). He has a strong family history of depression.   He has been having sleep difficulty off and on. He will sleep fine for a few months and then have significant difficulty falling asleep or have  early awakening with inability to go back to sleep for a few months. He feels this has gotten worse over the last 4-5 years. He drinks caffeine during the day at work, last cup at about 3 pm. He does not get regular physical exercise.   He has a long history of headaches. Dr. Tomi Likens provided a list of recommendations that are predominantly  lifestyle changes, and he knows they are good recommendations but he is having difficulty committing to making changes. However, he does not want to jump to taking medication for the headaches without first trying the lifestyle changes. He decided the first recommendation he will commit to is keeping a headache diary, and I will follow up with him on that when he comes back to see me for his next visit.  Overall, the patient reports that sleep problems, depression (mood swings) and forgetfulness are the 3 things that bother him most.   Social History: Born/Raised: [place] in 1995 Education: Bachelor's degree Occupational history: IT Marital history: Divorced 1 daughter, also raised 2 step children and is still involved as a parent in their lives Alcohol: Occasional drink to relax Tobacco: Never smoker SA: No   Medical History: Past Medical History:  Diagnosis Date  . ALLERGIC RHINITIS 10/07/2008  . COLONIC POLYPS, HX OF 10/07/2008  . GERD 10/07/2008  . Headache(784.0)    sinus  . HYPERLIPIDEMIA 10/07/2008  . Hypertriglyceridemia 07/12/2011  . Mood swings ([place])   . Vitamin D deficiency 07/12/2011     Current Medications:  Outpatient Encounter Prescriptions as of 01/23/2017  Medication Sig  . fenofibrate 160 MG tablet TAKE 1 TABLET DAILY  . lamoTRIgine (LAMICTAL) 200 MG tablet Take 200 mg by mouth daily.  . nortriptyline (PAMELOR) 10 MG capsule Take 1 capsule (10 mg total) by mouth at bedtime.  Marland Kitchen omeprazole (PRILOSEC) 20 MG capsule TAKE 1 CAPSULE DAILY  . SUMAtriptan (IMITREX) 100 MG tablet Take 1 tablet earliest onset of migraine.  May repeat once in 2 hours if headache persists or recurs.  . Triamcinolone Acetonide (NASACORT ALLERGY 24HR NA) Place into the nose.   No facility-administered encounter medications on file as of 01/23/2017.      Behavioral Observations:   Appearance: Neatly and appropriately dressed and groomed Gait: Ambulated independently, no  gross abnormalities observed Speech: Fluent; normal rate, rhythm and volume. No significant word finding difficulty. Thought process: Linear, goal directed Affect: Full, mildly anxious Interpersonal: Very pleasant, appropriate, engaged   TESTING: There is medical necessity to proceed with neuropsychological assessment as the results will be used to aid in differential diagnosis and clinical decision-making and to inform specific treatment recommendations. Per the patient and medical records reviewed, there has been a change in cognitive functioning and a reasonable suspicion of neurocognitive disorder.   PLAN: The patient will return for a full battery of neuropsychological testing with a psychometrician under my supervision. Education regarding testing procedures was provided. Subsequently, the patient will see this provider for a follow-up session at which time his test performances and my impressions and treatment recommendations will be reviewed in detail.  Full neuropsychological evaluation report to follow.

## 2017-02-06 ENCOUNTER — Ambulatory Visit (INDEPENDENT_AMBULATORY_CARE_PROVIDER_SITE_OTHER): Payer: 59 | Admitting: Psychology

## 2017-02-06 DIAGNOSIS — R413 Other amnesia: Secondary | ICD-10-CM | POA: Diagnosis not present

## 2017-02-06 NOTE — Progress Notes (Signed)
   Neuropsychology Note  Jonathan Mills returned today for 3 hours of neuropsychological testing with technician, Milana Kidney, BS, under the supervision of Dr. Macarthur Critchley. The patient did not appear overtly distressed by the testing session, per behavioral observation or via self-report to the technician. Rest breaks were offered. Jonathan Mills will return within 2 weeks for a feedback session with Dr. Si Raider at which time his test performances, clinical impressions and treatment recommendations will be reviewed in detail. The patient understands he can contact our office should he require our assistance before this time.  Full report to follow.

## 2017-02-08 NOTE — Progress Notes (Signed)
NEUROPSYCHOLOGICAL EVALUATION   Name:    Jonathan Mills  Date of Birth:   [birth year] Date of Interview:  01/23/2017 Date of Testing:  02/06/2017   Date of Feedback:  02/12/2017       Background Information:  Reason for Referral:  Jonathan Mills is a 56 y.o. male referred by Dr. Metta Clines to assess his current level of cognitive functioning and assist in differential diagnosis. Jonathan current evaluation consisted of a review of available medical records, an interview with Jonathan Mills and Jonathan completion of a neuropsychological testing battery. Informed consent was obtained.  History of Presenting Problem:  Jonathan Mills saw Dr. Tomi Likens for neurologic consultation on 01/19/2017 for headache and subjective memory complaints. MoCA was 24/30. At today's visit, Jonathan Mills reports gradual onset and progressive worsening of short term memory problems over Jonathan past few years. This concerns him because he has a family history of Alzheimer's disease (maternal grandfather had AD beginning in his 68s) and brain cancer (father who died at 74yo).   Jonathan Mills reports that, for example, if he is in a meeting at work, he has to write down exactly what is expected of him after Jonathan meeting or else he will not remember. In a hallway conversation, someone may ask him to do something and then two days later they come back and say he never did it, and he will have no memory of Jonathan entire conversation. He also has difficulty retrieving names of people when he goes to introduce them to someone else.   Upon direct questioning, Jonathan Mills reported Jonathan following with regard to current cognitive functioning:   Forgetting recent conversations/events: Yes Repeating statements/questions: No Misplacing/losing items: No Forgetting appointments or other obligations: No, but has had to start using very detailed notes and calendars and reminders Forgetting to take medications: Yes (forget morning meds probably once every two weeks, so has a  spare set at work) Forgetting to pay bills: No  Difficulty concentrating: No Starting but not finishing tasks: No   Word-finding difficulty: Yes Writing difficulty: No Spelling difficulty: No Comprehension difficulty: No  Making wrong turns when driving: "Navigation is not my strong suit", but never has been, this is not changed Uncertain about directions when driving or passenger: As above Getting lost when driving: No  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 He likes his work. He is single (divorced two years ago). He lives alone but has his 61yo daughter 50% of Jonathan time.  He has a long history of depression. He is a Mills at [employer]. He was tried on several different SSRIs but he could not tolerate negative side effects. Because of this, and because his depression is characterized by mood swings, he was started on lamotrigine. He does feel Jonathan medication has helped to some extent but he still has mood swings. He state that throughout Jonathan day, he will be happy and then all of a sudden feel depressed. He has also experienced episodes of depression that last for several days, but it has never prevented him from going to work. He reports Jonathan main symptom of his depression is apathy. This has been present his whole life but worse since his divorce 2 years ago. He did participate in talk therapy at [facility] in Jonathan past and found this very helpful. He now sees psychiatry every 6 months for med management. He denies any recent suicidal ideation (  last experienced this in his 80s or 96s). He has a strong family history of depression.   He has been having sleep difficulty off and on. He will sleep fine for a few months and then have significant difficulty falling asleep or have early awakening with inability to go back to sleep for a few months. He feels this has gotten worse over Jonathan last 4-5  years. He drinks caffeine during Jonathan day at work, last cup at about 3 pm. He does not get regular physical exercise.   He has a long history of headaches. Dr. Tomi Likens provided a list of recommendations that are predominantly lifestyle changes, and he knows they are good recommendations but he is having difficulty committing to making changes. However, he does not want to jump to taking medication for Jonathan headaches without first trying Jonathan lifestyle changes. He decided Jonathan first recommendation he will commit to is keeping a headache diary, and I will follow up with him on that when he comes back to see me for his next visit.  Overall, Jonathan Mills reports that sleep problems, depression (mood swings) and forgetfulness are Jonathan 3 things that bother him most.   Social History: Born/Raised: [place] in [place] Education: Bachelor's degree Occupational history: IT Marital history: Divorced 1 daughter, also raised 2 step children and is still involved as a parent in their lives Alcohol: Occasional drink to relax Tobacco: Never smoker SA: No   Medical History:  Past Medical History:  Diagnosis Date  . ALLERGIC RHINITIS 10/07/2008  . COLONIC POLYPS, HX OF 10/07/2008  . GERD 10/07/2008  . Headache(784.0)    sinus  . HYPERLIPIDEMIA 10/07/2008  . Hypertriglyceridemia 07/12/2011  . Mood swings ([place])   . Vitamin D deficiency 07/12/2011    Current medications:  Outpatient Encounter Prescriptions as of 02/12/2017  Medication Sig  . fenofibrate 160 MG tablet TAKE 1 TABLET DAILY  . lamoTRIgine (LAMICTAL) 200 MG tablet Take 200 mg by mouth daily.  . nortriptyline (PAMELOR) 10 MG capsule Take 1 capsule (10 mg total) by mouth at bedtime.  Marland Kitchen omeprazole (PRILOSEC) 20 MG capsule TAKE 1 CAPSULE DAILY  . SUMAtriptan (IMITREX) 100 MG tablet Take 1 tablet earliest onset of migraine.  May repeat once in 2 hours if headache persists or recurs.  . Triamcinolone Acetonide (NASACORT ALLERGY 24HR  NA) Place into Jonathan nose.   No facility-administered encounter medications on file as of 02/12/2017.      Current Examination:  Behavioral Observations:  Behavioral Observations:   Appearance: Neatly and appropriately dressed and groomed Gait: Ambulated independently, no gross abnormalities observed Speech: Fluent; normal rate, rhythm and volume. No significant word finding difficulty. Thought process: Linear, goal directed Affect: Full, mildly anxious Interpersonal: Very pleasant, appropriate, engaged Orientation: Oriented to person, place and most aspects of time (one day off on Jonathan date). Accurately named Jonathan current President and his predecessor.    Tests Administered: . Test of Premorbid Functioning (TOPF) . Wechsler Adult Intelligence Scale-Fourth Edition (WAIS-IV): Similarities, Information, Block Design, Matrix Reasoning, Arithmetic, Symbol Search, Coding and Digit Span subtests . Wechsler Memory Scale-Fourth Edition (WMS-IV) Adult Version (ages 22-69): Logical Memory I, II and Recognition subtests  . [place] Verbal Learning Test - 2nd Edition (CVLT-2) Standard Form . [employer] (WCST) . Repeatable Battery for Jonathan Assessment of Neuropsychological Status (RBANS) Form A:  Figure Copy and Figure Recall subtests . Neuropsychological Assessment Battery (NAB) Language Module, Form 1:  Naming subtest . Controlled Oral Word Association  Test (COWAT) . Trail Making Test A and B . Boston Diagnostic Aphasia Examination (BDAE): Complex Ideational Material Subtest . Beck Depression Inventory - Second edition (BDI-II) . Generalized Anxiety Disorder - 7 item screener (GAD-7)   Test Results: Note: Standardized scores are presented only for use by appropriately trained professionals and to allow for any future test-retest comparison. These scores should not be interpreted without consideration of all Jonathan information that is contained in Jonathan rest of Jonathan report. Jonathan most  recent standardization samples from Jonathan test publisher or other sources were used whenever possible to derive standard scores; scores were corrected for age, gender, ethnicity and education when available.   Test Scores:  Test Name Raw Score Standardized Score Descriptor  TOPF 55/70 SS= 112 High average  WAIS-IV Subtests     Similarities 32/36 ss= 14 Superior  Information 16/26 ss= 11 Average  Block Design 49/66 ss= 13 High average  Matrix Reasoning 22/26 ss= 15 Superior  Arithmetic 14/22 ss= 10 Average  Symbol Search 28/60 ss= 10 Average  Coding 69/135 ss= 12 High average  Digit Span 32/48 ss= 13 High average  WAIS-IV Index Scores     Verbal Comprehension  SS= 114 High average  Perceptual Reasoning  SS= 123 Superior  Working Memory  SS= 108 Average  Processing Speed  SS= 105 Average  Full Scale IQ (8 subtest)  SS= 115 High average  WMS-IV Subtests     LM I 25/50 ss= 10 Average  LM II 20/50 ss= 10 Average  LM II Recognition 25/30 Cum %: 51-75 WNL  CVLT-II Scores     Trial 1 7/16 Z= 0 Average  Trial 5 14/16 Z= 1 High average  Trials 1-5 total 52/80 T= 58 High average  SD Free Recall 12/16 Z= 1 High average  SD Cued Recall 12/16 Z= 0.5 Average  LD Free Recall 13/16 Z= 1 High average  LD Cued Recall 13/16 Z= 1 High average  Recognition Discriminability 16/16 hits, 2 false positives Z= 1 High average  Forced Choice Recognition 16/16  WNL  WCST     Total Errors 10 T= 55 Average  Perseverative Responses 9 T= 46 Average  Perseverative Errors 8 T= 43 Average  Conceptual Level Responses 53 T= 53 Average  Categories Completed 4 >16% WNL  Trials to Complete 1st Category 12 >16% WNL  Failure to Maintain Set 0  WNL  RBANS Subtests     Figure Copy 20/20 Z= 1.3 High average  Figure Recall 13/20 Z= -0.2 Average  NAB Language Naming 31/31 T= 52 Average  COWAT-FAS 41 T= 47 Average  COWAT-Animals 18 T= 43 Average  BDAE Complex Ideational Material 12/12  WNL  Trail Making Test A   20" 1 error T= 64 Superior  Trail Making Test B  41" 0 errors T= 65 Superior  BDI-II 21/63  Moderate  GAD-7 9/21  Mild     Description of Test Results:  Embedded performance validity indicators were within normal limits. As such, Jonathan Mills's current performance on neurocognitive testing is judged to be a relatively accurate representation of his current level of neurocognitive functioning.   Premorbid verbal intellectual abilities were estimated to have been within Jonathan high average range based on a test of word reading. His current full scale IQ fell within Jonathan high average range as well. Among Jonathan four indices that comprise Jonathan FSIQ, perceptual reasoning was an area of relative strength, falling in Jonathan superior range. Verbal comprehension was high average.   Psychomotor  processing speed was average. Auditory attention and working memory were average. Visual-spatial construction was high average. Language abilities were intact. Specifically, confrontation naming was average (with 100% accuracy), and semantic verbal fluency was average. Auditory comprehension of complex ideational material was normal. With regard to verbal memory, encoding and acquisition of non-contextual information (i.e., word list) was high average across five learning trials. After an interference task, free recall was high average (12/16 items recalled). He did not recall any additional words with semantic cueing (average). After a delay, free recall was high average (13/16 items recalled). He again did not recall any additional words with semantic cueing (high average). Performance on a yes/no recognition task was high average. On another verbal memory test, encoding and acquisition of contextual auditory information (i.e., short stories) was average. After a delay, free recall was average. Performance on a yes/no recognition task was within normal limits. With regard to non-verbal memory, delayed free recall of visual  information was average. Executive functioning was intact. Mental flexibility and set-shifting were superior on Trails B. Verbal fluency with phonemic search restrictions was average. Verbal abstract reasoning was superior. Non-verbal abstract reasoning was superior. Deductive reasoning was intact.   On a self-report measure of mood, Jonathan Mills's responses were indicative of clinically significant depression at Jonathan present time. He endorsed passive suicidal ideation but denied intention or plan. On a self-report measure of anxiety, Jonathan Mills endorsed mild generalized anxiety over Jonathan past two weeks but noted that a word trip occurred during this time which increased anxiety level.   Clinical Impressions: Major depressive disorder. Cognitive testing entirely within normal limits and commensurate with estimated premorbid intellectual abilities in Jonathan high average to superior range. There is no evidence of an underlying neurocognitive disorder. There is evidence of ongoing depression. He also reports sleep difficulty. Both of these factors, along with normal aging, are likely contributing to his memory complaints. Ongoing mental health treatment is recommended. Jonathan current results are reassuring and will provide a nice baseline for future comparison if ever needed.    Feedback to Mills: Jonathan Mills returned for a feedback appointment on 02/12/2017 to review Jonathan results of his neuropsychological evaluation with this provider. 55 minutes face-to-face time was spent reviewing his test results, my impressions and my recommendations as detailed above. We spoke about Jonathan importance of cardiovascular exercise for mood, sleep and brain health. We spoke at length about Jonathan impact of depression on cognitive functioning (and perception of cognitive functioning). Jonathan Mills reported that he is not currently seeing a therapist but would like to again. He accepted a referral to [facility] for this Lattie Haw  [place] or Royetta Crochet). He will be an excellent candidate for cognitive behavioral therapy.    Total time spent on this Mills's case: [id number] unit for interview with psychologist; [phone] units of testing by psychometrician under psychologist's supervision; [phone] units for medical record review, scoring of neuropsychological tests, interpretation of test results, preparation of this report, and review of results to Jonathan Mills by psychologist.      Thank you for your referral of Jonathan Mills. Please feel free to contact me if you have any questions or concerns regarding this report.

## 2017-02-12 ENCOUNTER — Ambulatory Visit (INDEPENDENT_AMBULATORY_CARE_PROVIDER_SITE_OTHER): Payer: 59 | Admitting: Psychology

## 2017-02-12 ENCOUNTER — Encounter: Payer: Self-pay | Admitting: Psychology

## 2017-02-12 DIAGNOSIS — F32A Depression, unspecified: Secondary | ICD-10-CM

## 2017-02-12 DIAGNOSIS — R413 Other amnesia: Secondary | ICD-10-CM

## 2017-02-12 DIAGNOSIS — F329 Major depressive disorder, single episode, unspecified: Secondary | ICD-10-CM

## 2017-02-12 NOTE — Patient Instructions (Signed)
Cognitive testing entirely within normal limits and commensurate with estimated premorbid intellectual abilities in the high average to superior range.  There is NO evidence of underlying neurocognitive disorder. Depression, sleep difficulty and normal aging all may contribute to subjective cognitive decline in daily life.  Ongoing mental health treatment is recommended. Cardiovascular exercise (e.g., brisk walking) is highly recommended as it has been shown to improve sleep, mood and brain health.  The current results are reassuring and will provide a nice baseline for future comparison if ever needed.

## 2017-04-02 ENCOUNTER — Ambulatory Visit (INDEPENDENT_AMBULATORY_CARE_PROVIDER_SITE_OTHER): Payer: 59 | Admitting: Psychology

## 2017-04-02 DIAGNOSIS — F331 Major depressive disorder, recurrent, moderate: Secondary | ICD-10-CM | POA: Diagnosis not present

## 2017-04-16 ENCOUNTER — Ambulatory Visit (INDEPENDENT_AMBULATORY_CARE_PROVIDER_SITE_OTHER): Payer: 59 | Admitting: Psychology

## 2017-04-16 DIAGNOSIS — F331 Major depressive disorder, recurrent, moderate: Secondary | ICD-10-CM | POA: Diagnosis not present

## 2017-04-24 ENCOUNTER — Ambulatory Visit: Payer: 59 | Admitting: Neurology

## 2017-05-01 ENCOUNTER — Telehealth: Payer: Self-pay | Admitting: Neurology

## 2017-05-01 ENCOUNTER — Ambulatory Visit (INDEPENDENT_AMBULATORY_CARE_PROVIDER_SITE_OTHER): Payer: 59 | Admitting: Psychology

## 2017-05-01 DIAGNOSIS — F331 Major depressive disorder, recurrent, moderate: Secondary | ICD-10-CM

## 2017-05-01 MED ORDER — PREDNISONE 10 MG (21) PO TBPK: ORAL_TABLET | ORAL | 0 refills | Status: DC

## 2017-05-01 NOTE — Telephone Encounter (Signed)
Called Pt, advsd am sending in prednisone taper to  [facility]

## 2017-05-01 NOTE — Telephone Encounter (Signed)
Yes, let's give him a prednisone taper (10mg  tablets.  Take 6tabs x1day, then 5tabs x1day, then 4tabs x1day, then 3tabs x1day, then 2tabs x1day, then 1tab x1day, then STOP)

## 2017-05-01 NOTE — Telephone Encounter (Signed)
Spoke with Pt, he has had headaches for 11 days. Pt admitted to taking sumatriptan every day. He states he did not start the nortriptyline until 3 days ago, he wanted to try liffe style modification first. Advsd Pt not to take sumatriptan more 2 day and 2 days a week. May Pt have Prednisone taper?

## 2017-05-01 NOTE — Telephone Encounter (Signed)
Pt called and said he has had migraines for 11 days now and please call and advise

## 2017-05-09 ENCOUNTER — Telehealth: Payer: Self-pay | Admitting: Neurology

## 2017-05-09 ENCOUNTER — Ambulatory Visit (INDEPENDENT_AMBULATORY_CARE_PROVIDER_SITE_OTHER): Payer: 59

## 2017-05-09 DIAGNOSIS — IMO0002 Reserved for concepts with insufficient information to code with codable children: Secondary | ICD-10-CM

## 2017-05-09 DIAGNOSIS — G43709 Chronic migraine without aura, not intractable, without status migrainosus: Secondary | ICD-10-CM | POA: Diagnosis not present

## 2017-05-09 MED ORDER — DIPHENHYDRAMINE HCL 50 MG/ML IJ SOLN
25.0000 mg | Freq: Once | INTRAMUSCULAR | Status: AC
  Administered 2017-05-09: 25 mg via INTRAMUSCULAR

## 2017-05-09 MED ORDER — METOCLOPRAMIDE HCL 5 MG/ML IJ SOLN
10.0000 mg | Freq: Once | INTRAVENOUS | Status: AC
  Administered 2017-05-09: 10 mg via INTRAMUSCULAR

## 2017-05-09 MED ORDER — KETOROLAC TROMETHAMINE 60 MG/2ML IM SOLN
60.0000 mg | Freq: Once | INTRAMUSCULAR | Status: AC
  Administered 2017-05-09: 60 mg via INTRAMUSCULAR

## 2017-05-09 NOTE — Telephone Encounter (Signed)
Called and talked with Pt, offered a headache cocktail injection, advsd Pt will need a driver, he is not sure he has one, I advsd him if no driver can still have Toradol injection. Pt will be here between 1-4pm today. [place] desk aware.

## 2017-05-09 NOTE — Telephone Encounter (Signed)
Patient states he still has a migraine after 15 days and wants to talk to someone about what to do please call

## 2017-05-11 ENCOUNTER — Telehealth: Payer: Self-pay | Admitting: Neurology

## 2017-05-11 NOTE — Telephone Encounter (Signed)
Would increase nortriptyline to 20mg  qhs. Thanks

## 2017-05-11 NOTE — Telephone Encounter (Signed)
Dr Delice Lesch-  Dr Tomi Likens has started Pt on nortriptyline, increased topiramate, he had a prednisone taper 12/18, headache cocktail 12/26-please advise

## 2017-05-11 NOTE — Telephone Encounter (Signed)
Called Pt, advsd to increase nortriptyline, also went over directions for triptans

## 2017-05-11 NOTE — Telephone Encounter (Signed)
Headache is back today and had a headache cocktail on 05-09-17 needs to know what to do now

## 2017-05-17 ENCOUNTER — Ambulatory Visit (INDEPENDENT_AMBULATORY_CARE_PROVIDER_SITE_OTHER): Payer: 59 | Admitting: Psychology

## 2017-05-17 DIAGNOSIS — F331 Major depressive disorder, recurrent, moderate: Secondary | ICD-10-CM | POA: Diagnosis not present

## 2017-05-28 ENCOUNTER — Ambulatory Visit (INDEPENDENT_AMBULATORY_CARE_PROVIDER_SITE_OTHER): Payer: 59 | Admitting: Neurology

## 2017-05-28 ENCOUNTER — Encounter: Payer: Self-pay | Admitting: Neurology

## 2017-05-28 VITALS — BP 112/68 | HR 76 | Ht 72.0 in | Wt 196.0 lb

## 2017-05-28 DIAGNOSIS — R51 Headache: Secondary | ICD-10-CM

## 2017-05-28 DIAGNOSIS — G43709 Chronic migraine without aura, not intractable, without status migrainosus: Secondary | ICD-10-CM | POA: Diagnosis not present

## 2017-05-28 DIAGNOSIS — IMO0002 Reserved for concepts with insufficient information to code with codable children: Secondary | ICD-10-CM

## 2017-05-28 DIAGNOSIS — R519 Headache, unspecified: Secondary | ICD-10-CM

## 2017-05-28 NOTE — Progress Notes (Signed)
NEUROLOGY FOLLOW UP OFFICE NOTE  Jonathan Mills [id number]  HISTORY OF PRESENT ILLNESS: Jonathan Mills is a 57  year old male who presents for headache and memory deficits.    I  MIGRAINE; II TENSION-TYPE Update: At first, he never started the nortriptyline.  He has had intractable daily migraines in December, lasting all day everyday.  Prednisone taper aborted the headache but it returned the day after he finished the taper.  Headache cocktail (Toradol/Reglan/Benadryl) helped for a day.  He started nortriptyline 10mg  around 12/17 and has not had a migraine for the past 2 weeks.  About once a week, he may wake up with very dull headache but he goes back to sleep and when he wakes up later, he feels fine. Current NSAIDS:  no Current analgesics:  Tylenol (for body aches) Current triptans:  sumatriptan 100mg  Current anti-emetic:  no Current muscle relaxants:  no Current anti-anxiolytic:  no Current sleep aide:  no Current Antihypertensive medications:  no Current Antidepressant medications:  nortriptyline 20mg  Current Anticonvulsant medications:  lamotrigine 200mg  Current Vitamins/Herbal/Supplements:  no Current Antihistamines/Decongestants:  no Other therapy:  Chiropractor once a month.   Caffeine:  Decreased to 2 cups coffee daily, now drinks caffeine-free Mt. Dew Alcohol:  occasionally Smoker:  no Diet:  Does not hydrate Exercise:  no Depression/anxiety:  depression Sleep hygiene:  Variable  Because of his father, he is concerned that he may have a brain tumor.  History: Onset:  History of tension type headaches since 20s.  Current headaches started 2016. Location:  Unilateral Quality:  sharp Intensity:  9-10/10 Aura:  No Prodrome:  No Postdrome:  No Associated symptoms:  Nausea, photophobia, phonophobia, sometimes vomiting.  No visual disturbance.  She has not had any new worse headache of her life, waking up from sleep Duration:  2-3 days; Frequency:  1 to 2 times a  month Frequency of abortive medication: 1-2 days a week (Tylenol for body aches) Triggers/exacerbating factors:  no Relieving factors:  no Activity:  Aggravates   He also has history of chronic tension-type headaches that have been resolved for several years.   Past NSAIDS:  Ibuprofen, naproxen Past analgesics:  no Past abortive triptans:  no Past muscle relaxants:  no Past anti-emetic:  no Past antihypertensive medications:  no Past antidepressant medications:  Lexapro Past anticonvulsant medications:  no Past vitamins/Herbal/Supplements:  no Other past therapies:  no   Family history of headache:  Mom had migraines Other family history:  Father: brain cancer (possibly GBM), maternal grandfather: Alzheimer's dementia.   MEMORY DEFICITS: Update: He underwent neuropsychological testing on 02/12/17, which demonstrated depression but no cognitive impairment.  History: He is also concerned about his memory, which has progressively gotten worse since 2016.  He endorses short term memory deficits.  He forgets conversations after a couple of days.  He forgets names of familiar people and only recalls it when he walks away.  He needs to write things down to remind himself.  He is able to perform all ADLs.  He does not get disoriented when driving on familiar routes.  He is able to manage his finances and pay his bills.  He is concerned because his maternal grandfather had Alzheimer's beginning in his 75s.  PAST MEDICAL HISTORY: Past Medical History:  Diagnosis Date  . ALLERGIC RHINITIS 10/07/2008  . COLONIC POLYPS, HX OF 10/07/2008  . GERD 10/07/2008  . Headache(784.0)    sinus  . HYPERLIPIDEMIA 10/07/2008  . Hypertriglyceridemia 07/12/2011  . Mood  swings   . Vitamin D deficiency 07/12/2011    MEDICATIONS: Current Outpatient Medications on File Prior to Visit  Medication Sig Dispense Refill  . fenofibrate 160 MG tablet TAKE 1 TABLET DAILY 90 tablet 3  . lamoTRIgine (LAMICTAL) 200 MG  tablet Take 200 mg by mouth daily.    . nortriptyline (PAMELOR) 10 MG capsule Take 1 capsule (10 mg total) by mouth at bedtime. 30 capsule 3  . omeprazole (PRILOSEC) 20 MG capsule TAKE 1 CAPSULE DAILY 90 capsule 3  . predniSONE (STERAPRED UNI-PAK 21 TAB) 10 MG (21) TBPK tablet 6 tabs day1, 5 tabs day2, 4 tabs day3, 3 tabs day4, 2 tabs day5, 1 tab day 6 (Patient not taking: Reported on 05/28/2017) 21 tablet 0  . SUMAtriptan (IMITREX) 100 MG tablet Take 1 tablet earliest onset of migraine.  May repeat once in 2 hours if headache persists or recurs. 10 tablet 2  . Triamcinolone Acetonide (NASACORT ALLERGY 24HR NA) Place into the nose.     No current facility-administered medications on file prior to visit.     ALLERGIES: Allergies  Allergen Reactions  . Penicillins     REACTION: rash    FAMILY HISTORY: Family History  Problem Relation Age of Onset  . Cancer Father     SOCIAL HISTORY: Social History   Socioeconomic History  . Marital status: Single    Spouse name: Not on file  . Number of children: Not on file  . Years of education: BS  . Highest education level: Not on file  Social Needs  . Financial resource strain: Not on file  . Food insecurity - worry: Not on file  . Food insecurity - inability: Not on file  . Transportation needs - medical: Not on file  . Transportation needs - non-medical: Not on file  Occupational History  . Occupation: IT  Tobacco Use  . Smoking status: Never Smoker  . Smokeless tobacco: Never Used  Substance and Sexual Activity  . Alcohol use: Yes    Alcohol/week: 0.6 oz    Types: 1 Cans of beer per week  . Drug use: No  . Sexual activity: Not on file  Other Topics Concern  . 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    REVIEW OF SYSTEMS: Constitutional: No fevers, chills, or sweats, no generalized fatigue, change in appetite Eyes: No visual changes, double vision, eye pain Ear,  nose and throat: No hearing loss, ear pain, nasal congestion, sore throat Cardiovascular: No chest pain, palpitations Respiratory:  No shortness of breath at rest or with exertion, wheezes GastrointestinaI: No nausea, vomiting, diarrhea, abdominal pain, fecal incontinence Genitourinary:  No dysuria, urinary retention or frequency Musculoskeletal:  No neck pain, back pain Integumentary: No rash, pruritus, skin lesions Neurological: as above Psychiatric: Depression, anxiety Endocrine: No palpitations, fatigue, diaphoresis, mood swings, change in appetite, change in weight, increased thirst Hematologic/Lymphatic:  No purpura, petechiae. Allergic/Immunologic: no itchy/runny eyes, nasal congestion, recent allergic reactions, rashes  PHYSICAL EXAM: Vitals:   05/28/17 1318  BP: 112/68  Pulse: 76  SpO2: 98%   General: No acute distress.  Patient appears we-groomed.  Head:  Normocephalic/atraumatic Eyes:  Fundi examined but not visualized Neck: supple, no paraspinal tenderness, full range of motion Heart:  Regular rate and rhythm Lungs:  Clear to auscultation bilaterally Back: No paraspinal tenderness Neurological Exam: alert and oriented to person, place, and time. Attention span and concentration intact,  recent and remote memory intact, fund of knowledge intact.  Speech fluent and not dysarthric, language intact.  CN II-XII intact. Bulk and tone normal, muscle strength 5/5 throughout.  Sensation to light touch  intact.  Deep tendon reflexes 2+ throughout.  Finger to nose testing intact.  Gait normal  IMPRESSION: Migraines.  He is concerned that he may have a brain tumor since his father had a brain tumor and died at age 60.  Since these are new onset headaches in a man over 47, we will check an MRI.  PLAN: 1.  Check MRI of brain without contrast 2.  Continue nortriptyline 10mg  at bedtime 3.  Sumatriptan as needed. 4.  Lifestyle modification:  Exercise, increase water intake, decrease  caffeine and Mt. Dew. 5.  Follow up in 3 months.  Metta Clines, DO  CC:  Cathlean Cower, MD      -

## 2017-05-28 NOTE — Patient Instructions (Signed)
Migraine Recommendations: 1.  Continue nortriptyline 10mg  at bedtime 2.  Take sumatriptan at earliest onset of headache.  May repeat dose once in 2 hours if needed.  Do not exceed two tablets in 24 hours. 3.  Limit use of pain relievers to no more than 2 days out of the week.  These medications include acetaminophen, ibuprofen, triptans and narcotics.  This will help reduce risk of rebound headaches. 4.  Be aware of common food triggers such as processed sweets, processed foods with nitrites (such as deli meat, hot dogs, sausages), foods with MSG, alcohol (such as wine), chocolate, certain cheeses, certain fruits (dried fruits, bananas, some citrus fruit), vinegar, diet soda. 4.  Avoid caffeine 5.  Routine exercise 6.  Proper sleep hygiene 7.  Stay adequately hydrated with water 8.  Keep a headache diary. 9.  Maintain proper stress management. 10.  Do not skip meals. 11.  Consider supplements:  Magnesium citrate 400mg  to 600mg  daily, riboflavin 400mg , Coenzyme Q 10 100mg  three times daily 12.  We will check MRI of brain 13.  Follow up in 3 months.

## 2017-05-29 ENCOUNTER — Ambulatory Visit (INDEPENDENT_AMBULATORY_CARE_PROVIDER_SITE_OTHER): Payer: 59 | Admitting: Psychology

## 2017-05-29 DIAGNOSIS — F411 Generalized anxiety disorder: Secondary | ICD-10-CM

## 2017-06-06 ENCOUNTER — Ambulatory Visit
Admission: RE | Admit: 2017-06-06 | Discharge: 2017-06-06 | Disposition: A | Discharge status: Home / Self Care | Payer: 59 | Source: Ambulatory Visit | Attending: Neurology | Admitting: Neurology

## 2017-06-06 DIAGNOSIS — R519 Headache, unspecified: Secondary | ICD-10-CM

## 2017-06-06 DIAGNOSIS — R51 Headache: Principal | ICD-10-CM

## 2017-06-07 ENCOUNTER — Other Ambulatory Visit: Payer: Self-pay | Admitting: *Deleted

## 2017-06-07 MED ORDER — NORTRIPTYLINE HCL 10 MG PO CAPS: 10.0000 mg | ORAL_CAPSULE | Freq: Every day | ORAL | 3 refills | Status: DC

## 2017-06-12 ENCOUNTER — Ambulatory Visit (INDEPENDENT_AMBULATORY_CARE_PROVIDER_SITE_OTHER): Payer: 59 | Admitting: Psychology

## 2017-06-12 DIAGNOSIS — F411 Generalized anxiety disorder: Secondary | ICD-10-CM | POA: Diagnosis not present

## 2017-06-26 ENCOUNTER — Ambulatory Visit (INDEPENDENT_AMBULATORY_CARE_PROVIDER_SITE_OTHER): Payer: 59 | Admitting: Psychology

## 2017-06-26 DIAGNOSIS — F411 Generalized anxiety disorder: Secondary | ICD-10-CM | POA: Diagnosis not present

## 2017-09-05 ENCOUNTER — Encounter: Payer: Self-pay | Admitting: Neurology

## 2017-09-05 ENCOUNTER — Ambulatory Visit (INDEPENDENT_AMBULATORY_CARE_PROVIDER_SITE_OTHER): Payer: 59 | Admitting: Neurology

## 2017-09-05 VITALS — BP 102/72 | HR 76 | Ht 72.0 in | Wt 198.0 lb

## 2017-09-05 DIAGNOSIS — G44219 Episodic tension-type headache, not intractable: Secondary | ICD-10-CM

## 2017-09-05 DIAGNOSIS — G43009 Migraine without aura, not intractable, without status migrainosus: Secondary | ICD-10-CM | POA: Diagnosis not present

## 2017-09-05 MED ORDER — TOPIRAMATE ER 50 MG PO SPRINKLE CAP24: 50.0000 mg | EXTENDED_RELEASE_CAPSULE | Freq: Every day | ORAL | 0 refills | Status: DC

## 2017-09-05 NOTE — Patient Instructions (Signed)
1.  Stop nortriptyline.  Instead, start topiramate ER 50mg  at bedtime. Samples provided.  Contact us in 4 weeks with update and we can send in a prescription for pre-authorization. 2.  Use sumatriptan as directed 3.  Follow Mediterranean diet (see below) 4.  Routine exercise 5.  Proper sleep 6.  Follow up in 3 months.   Mediterranean Diet A Mediterranean diet refers to food and lifestyle choices that are based on the traditions of countries located on the [employer]. This way of eating has been shown to help prevent certain conditions and improve outcomes for people who have chronic diseases, like kidney disease and heart disease. What are tips for following this plan? Lifestyle  Cook and eat meals together with your family, when possible.  Drink enough fluid to keep your urine clear or pale yellow.  Be physically active every day. This includes: ? Aerobic exercise like running or swimming. ? Leisure activities like gardening, walking, or housework.  Get 7-8 hours of sleep each night.  If recommended by your health care provider, drink red wine in moderation. This means 1 glass a day for nonpregnant women and 2 glasses a day for men. A glass of wine equals 5 oz (150 mL). Reading food labels  Check the serving size of packaged foods. For foods such as [place] and pasta, the serving size refers to the amount of cooked product, not dry.  Check the total fat in packaged foods. Avoid foods that have saturated fat or trans fats.  Check the ingredients list for added sugars, such as corn syrup. Shopping  At the grocery store, buy most of your food from the areas near the walls of the store. This includes: ? Fresh fruits and vegetables (produce). ? Grains, beans, nuts, and seeds. Some of these may be available in unpackaged forms or large amounts (in bulk). ? Fresh seafood. ? Poultry and eggs. ? Low-fat dairy products.  Buy whole ingredients instead of prepackaged foods.  Buy  fresh fruits and vegetables in-season from local farmers markets.  Buy frozen fruits and vegetables in resealable bags.  If you do not have access to quality fresh seafood, buy precooked frozen shrimp or canned fish, such as tuna, salmon, or sardines.  Buy small amounts of raw or cooked vegetables, salads, or olives from the deli or salad bar at your store.  Stock your pantry so you always have certain foods on hand, such as olive oil, canned tuna, canned tomatoes, [place], pasta, and beans. Cooking  Cook foods with extra-virgin olive oil instead of using butter or other vegetable oils.  Have meat as a side dish, and have vegetables or grains as your main dish. This means having meat in small portions or adding small amounts of meat to foods like pasta or stew.  Use beans or vegetables instead of meat in common dishes like chili or lasagna.  Experiment with different cooking methods. Try roasting or broiling vegetables instead of steaming or sauteing them.  Add frozen vegetables to soups, stews, pasta, or [place].  Add nuts or seeds for added healthy fat at each meal. You can add these to yogurt, salads, or vegetable dishes.  Marinate fish or vegetables using olive oil, lemon juice, garlic, and fresh herbs. Meal planning  Plan to eat 1 vegetarian meal one day each week. Try to work up to 2 vegetarian meals, if possible.  Eat seafood 2 or more times a week.  Have healthy snacks readily available, such as: ? Vegetable sticks  with hummus. ? [place] yogurt. ? Fruit and nut trail mix.  Eat balanced meals throughout the week. This includes: ? Fruit: 2-3 servings a day ? Vegetables: 4-5 servings a day ? Low-fat dairy: 2 servings a day ? Fish, poultry, or lean meat: 1 serving a day ? Beans and legumes: 2 or more servings a week ? Nuts and seeds: 1-2 servings a day ? Whole grains: 6-8 servings a day ? Extra-virgin olive oil: 3-4 servings a day  Limit red meat and sweets to only a few  servings a month What are my food choices?  Mediterranean diet ? Recommended ? Grains: Whole-grain pasta. Brown [place]. Bulgar wheat. Polenta. Couscous. Whole-wheat bread. [place]. ? Vegetables: Artichokes. Beets. Broccoli. Cabbage. Carrots. Eggplant. Green beans. Chard. Kale. Spinach. Onions. Leeks. Peas. Squash. Tomatoes. Peppers. Radishes. ? Fruits: Apples. Apricots. Avocado. Berries. Bananas. Cherries. Dates. Figs. Grapes. Lemons. Melon. Oranges. Peaches. Plums. Pomegranate. ? Meats and other protein foods: Beans. Almonds. Sunflower seeds. Pine nuts. Peanuts. [place]. Salmon. Scallops. Shrimp. [place]. Tilapia. Clams. Oysters. Eggs. ? Dairy: Low-fat milk. Cheese. Greek yogurt. ? Beverages: Water. Red wine. Herbal tea. ? Fats and oils: Extra virgin olive oil. Avocado oil. Grape seed oil. ? Sweets and desserts: [place] yogurt with honey. Baked apples. Poached pears. Trail mix. ? Seasoning and other foods: Basil. Cilantro. Coriander. Cumin. Mint. Parsley. Sage. Rosemary. Tarragon. Garlic. Oregano. Thyme. Pepper. Balsalmic vinegar. Tahini. Hummus. Tomato sauce. Olives. Mushrooms. ? Limit these ? Grains: Prepackaged pasta or Broce dishes. Prepackaged cereal with added sugar. ? Vegetables: Deep fried potatoes (french fries). ? Fruits: Fruit canned in syrup. ? Meats and other protein foods: Beef. Pork. Lamb. Poultry with skin. Hot dogs. [place] Salines. ? Dairy: Ice cream. Sour cream. Whole milk. ? Beverages: Juice. Sugar-sweetened soft drinks. Beer. Liquor and spirits. ? Fats and oils: Butter. Canola oil. Vegetable oil. Beef fat (tallow). Lard. ? Sweets and desserts: Cookies. Cakes. Pies. Candy. ? Seasoning and other foods: Mayonnaise. Premade sauces and marinades. ? The items listed may not be a complete list. Talk with your dietitian about what dietary choices are right for you. Summary  The Mediterranean diet includes both food and lifestyle choices.  Eat a variety of fresh fruits and vegetables,  beans, nuts, seeds, and whole grains.  Limit the amount of red meat and sweets that you eat.  Talk with your health care provider about whether it is safe for you to drink red wine in moderation. This means 1 glass a day for nonpregnant women and 2 glasses a day for men. A glass of wine equals 5 oz (150 mL). This information is not intended to replace advice given to you by your health care provider. Make sure you discuss any questions you have with your health care provider. Document Released: 12/23/2015 Document Revised: 01/25/2016 Document Reviewed: 12/23/2015 Elsevier Interactive Patient Education  [employer].

## 2017-09-05 NOTE — Progress Notes (Signed)
NEUROLOGY FOLLOW UP OFFICE NOTE  VERNEL LANGENDERFER [id number]  HISTORY OF PRESENT ILLNESS: Jonathan Mills is a 57 year old male who follows up for migraine and tension-type headache.    UPDATE: He is doing well.  Headaches are more controlled and manageable with nortriptyline.  However, it causes constipation and has not helped with depression or mood (mood fluctuations are his greater concern).  Lamotrigine was discontinued since last visit. intensity:  8/10 but after sumatriptan, decreases to 3/10 Duration:  8/10 for a couple of hours and then 3/10 over the next 2 days.  He does not repeat dose of sumatriptan because he feels the headache is manageable. Frequency:  Once a month. He also has a severe central/diffuse nonthrobbing headache once a month, lasting 30 minutes.  He does not treat it. Current NSAIDS:  no Current analgesics:  Tylenol (for body aches) Current triptans:  sumatriptan 100mg  Current anti-emetic:  no Current muscle relaxants:  no Current anti-anxiolytic:  no Current sleep aide:  no Current Antihypertensive medications:  no Current Antidepressant medications:  nortriptyline 10mg  Current Anticonvulsant medications:  no Current Vitamins/Herbal/Supplements:  no Current Antihistamines/Decongestants:  no Other therapy:  Chiropractor once a month.   Caffeine:  Decreased to 2 cups coffee daily, now drinks caffeine-free Mt. Dew Alcohol:  occasionally Smoker:  no Diet:  Does not hydrate Exercise:  no Depression/anxiety:  Depression. Changes in mood. Sleep hygiene:  Variable   Because of his father, he is concerned that he may have a brain tumor given his memory deficits and headaches.  He had an MRI of the brain without contrast on 06/06/17 which was personally reviewed and demonstrated nonspecific subcortical white matter changes most likely representing chronic small vessel disease, as well as a right parietal cavernous hemangioma, but no concerning or emergent findings.     HISTORY: Onset:  History of tension type headaches since 20s.  Current headaches started 2016. Location:  Unilateral Quality:  sharp Initial Intensity:  9-10/10 Aura:  No Prodrome:  No Postdrome:  No Associated symptoms:  Nausea, photophobia, phonophobia, sometimes vomiting.  No visual disturbance.  She has not had any new worse headache of her life, waking up from sleep Initial Duration:  2-3 days; Initial Frequency:  1 to 2 times a month Initial Frequency of abortive medication: 1-2 days a week (Tylenol for body aches) Triggers/exacerbating factors:  no Relieving factors:  no Activity:  Aggravates   He also has history of chronic tension-type headaches that have been resolved for several years.   Past NSAIDS:  Ibuprofen, naproxen Past analgesics:  no Past abortive triptans:  no Past muscle relaxants:  no Past anti-emetic:  no Past antihypertensive medications:  no Past antidepressant medications:  Lexapro Past anticonvulsant medications:  lamotrigine Past vitamins/Herbal/Supplements:  no Other past therapies:  no   Family history of headache:  Mom had migraines Other family history:  Father: brain cancer (possibly GBM), maternal grandfather: Alzheimer's dementia.   Of note, he has endorsed memory deficits in the past.   He underwent neuropsychological testing on 02/12/17, which demonstrated depression but no cognitive impairment.    PAST MEDICAL HISTORY: Past Medical History:  Diagnosis Date  . ALLERGIC RHINITIS 10/07/2008  . COLONIC POLYPS, HX OF 10/07/2008  . GERD 10/07/2008  . Headache(784.0)    sinus  . HYPERLIPIDEMIA 10/07/2008  . Hypertriglyceridemia 07/12/2011  . Mood swings   . Vitamin D deficiency 07/12/2011    MEDICATIONS: Current Outpatient Medications on File Prior to Visit  Medication Sig  Dispense Refill  . fenofibrate 160 MG tablet TAKE 1 TABLET DAILY 90 tablet 3  . lamoTRIgine (LAMICTAL) 200 MG tablet Take 200 mg by mouth daily.    . nortriptyline  (PAMELOR) 10 MG capsule Take 1 capsule (10 mg total) by mouth at bedtime. 90 capsule 3  . omeprazole (PRILOSEC) 20 MG capsule TAKE 1 CAPSULE DAILY 90 capsule 3  . predniSONE (STERAPRED UNI-PAK 21 TAB) 10 MG (21) TBPK tablet 6 tabs day1, 5 tabs day2, 4 tabs day3, 3 tabs day4, 2 tabs day5, 1 tab day 6 (Patient not taking: Reported on 05/28/2017) 21 tablet 0  . SUMAtriptan (IMITREX) 100 MG tablet Take 1 tablet earliest onset of migraine.  May repeat once in 2 hours if headache persists or recurs. 10 tablet 2  . Triamcinolone Acetonide (NASACORT ALLERGY 24HR NA) Place into the nose.     No current facility-administered medications on file prior to visit.     ALLERGIES: Allergies  Allergen Reactions  . Penicillins     REACTION: rash    FAMILY HISTORY: Family History  Problem Relation Age of Onset  . Cancer Father     SOCIAL HISTORY: Social History   Socioeconomic History  . Marital status: Single    Spouse name: Not on file  . Number of children: Not on file  . Years of education: BS  . Highest education level: Not on file  Occupational History  . Occupation: IT  Social Needs  . Financial resource strain: Not on file  . Food insecurity:    Worry: Not on file    Inability: Not on file  . Transportation needs:    Medical: Not on file    Non-medical: Not on file  Tobacco Use  . Smoking status: Never Smoker  . Smokeless tobacco: Never Used  Substance and Sexual Activity  . Alcohol use: Yes    Alcohol/week: 0.6 oz    Types: 1 Cans of beer per week  . Drug use: No  . Sexual activity: Not on file  Lifestyle  . Physical activity:    Days per week: Not on file    Minutes per session: Not on file  . Stress: Not on file  Relationships  . Social connections:    Talks on phone: Not on file    Gets together: Not on file    Attends religious service: Not on file    Active member of club or organization: Not on file    Attends meetings of clubs or organizations: Not on file     Relationship status: Not on file  . Intimate partner violence:    Fear of current or ex partner: Not on file    Emotionally abused: Not on file    Physically abused: Not on file    Forced sexual activity: Not on file  Other Topics Concern  . 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    REVIEW OF SYSTEMS: Constitutional: No fevers, chills, or sweats, no generalized fatigue, change in appetite Eyes: No visual changes, double vision, eye pain Ear, nose and throat: No hearing loss, ear pain, nasal congestion, sore throat Cardiovascular: No chest pain, palpitations Respiratory:  No shortness of breath at rest or with exertion, wheezes GastrointestinaI: constipation Genitourinary:  No dysuria, urinary retention or frequency Musculoskeletal:  No neck pain, back pain Integumentary: No rash, pruritus, skin lesions Neurological: as above Psychiatric: depression, anxiety, change in  mood Endocrine: No palpitations, fatigue, diaphoresis, mood swings, change in appetite, change in weight, increased thirst Hematologic/Lymphatic:  No purpura, petechiae. Allergic/Immunologic: no itchy/runny eyes, nasal congestion, recent allergic reactions, rashes  PHYSICAL EXAM: Vitals:   09/05/17 0835  BP: 102/72  Pulse: 76  SpO2: 94%   General: No acute distress.  Patient appears well-groomed.  normal body habitus. Head:  Normocephalic/atraumatic Eyes:  Fundi examined but not visualized Neck: supple, no paraspinal tenderness, full range of motion Heart:  Regular rate and rhythm Lungs:  Clear to auscultation bilaterally Back: No paraspinal tenderness Neurological Exam: alert and oriented to person, place, and time. Attention span and concentration intact, recent and remote memory intact, fund of knowledge intact.  Speech fluent and not dysarthric, language intact.  CN II-XII intact. Bulk and tone normal, muscle strength 5/5 throughout.  Sensation to  light touch  intact.  Deep tendon reflexes 2+ throughout.  Finger to nose testing intact.  Gait normal, Romberg negative.  IMPRESSION: Migraine without aura, not intractable, without status migrainosus Tension type headache  PLAN: 1.  We will discontinue nortriptyline due to constipation and instead start topiramate ER 50mg  at bedtime.  It is also a mood stabilizer, so it has that benefit as well.  Side effects discussed.  Samples provided. 2.  Continue sumatriptan as directed.  Informed him that he may repeat dose once after 2 hours if needed. 3.  Limit pain relievers to no more than 2 days out of week 4.  Headache diary 5.  He is still concerned about Alzheimer's disease.  Recommended to follow Mediterranean diet, exercise, continue mentally stimulating activities and get proper sleep. 6.  Follow up in 3 months.  Metta Clines, DO  CC: Cathlean Cower, MD

## 2017-09-06 ENCOUNTER — Encounter: Payer: Self-pay | Admitting: Neurology

## 2017-10-15 ENCOUNTER — Telehealth: Payer: Self-pay | Admitting: Neurology

## 2017-10-15 NOTE — Telephone Encounter (Signed)
Patient called to update Dr. Tomi Likens 30 days after his last visit. His medication he has been on is Working for him. He needs to get approval for him to continue use. Please Call. Thanks

## 2017-10-16 NOTE — Telephone Encounter (Signed)
Called Pt to advise I have started PA, LMOVM, advised him if he is out of medication, I can provide samples until we can get PA determination

## 2017-10-16 NOTE — Progress Notes (Addendum)
Initiated on cover my meds on 10/15/17, was unable to verify Pt. [facility], they were unable to locate Pt.  10/16/17- Called [employer] again, spoke with [place]. She stated Pt has medical coverage with [employer], but not pharmacy coverage, and it was not connected to his medical coverage so they did not know who to contact.  Called [facility], spoke to [place]. Will need to call [employer] [phone] BIN: [id number] PCN: [id number] GRP: [id number] ID# [id number]  Called [employer], spoke with Horris Latino, she is faxing Quedexy form.  Rcvd form, faxing back [phone]

## 2017-10-17 NOTE — Progress Notes (Signed)
Rcvd fax from [employer], asking to clarify if was sprinkle caps. Called [phone], spoke with Nevin Bloodgood. She took all info again and approved PA# [id number] valid 09/17/17 - 10/17/18  Called [facility] [place], spoke with Lattie Haw.

## 2017-10-23 ENCOUNTER — Ambulatory Visit (INDEPENDENT_AMBULATORY_CARE_PROVIDER_SITE_OTHER): Payer: 59 | Admitting: Internal Medicine

## 2017-10-23 ENCOUNTER — Encounter: Payer: Self-pay | Admitting: Internal Medicine

## 2017-10-23 ENCOUNTER — Other Ambulatory Visit (INDEPENDENT_AMBULATORY_CARE_PROVIDER_SITE_OTHER): Payer: 59

## 2017-10-23 VITALS — BP 112/78 | HR 70 | Temp 97.8°F | Ht 72.0 in | Wt 197.0 lb

## 2017-10-23 DIAGNOSIS — Z114 Encounter for screening for human immunodeficiency virus [HIV]: Secondary | ICD-10-CM

## 2017-10-23 DIAGNOSIS — Z0000 Encounter for general adult medical examination without abnormal findings: Secondary | ICD-10-CM

## 2017-10-23 LAB — URINALYSIS, ROUTINE W REFLEX MICROSCOPIC
Bilirubin Urine: NEGATIVE
HGB URINE DIPSTICK: NEGATIVE
KETONES UR: NEGATIVE
Leukocytes, UA: NEGATIVE
NITRITE: NEGATIVE
RBC / HPF: NONE SEEN (ref 0–?)
Specific Gravity, Urine: 1.025 (ref 1.000–1.030)
Total Protein, Urine: NEGATIVE
URINE GLUCOSE: NEGATIVE
Urobilinogen, UA: 0.2 (ref 0.0–1.0)
pH: 6 (ref 5.0–8.0)

## 2017-10-23 LAB — LIPID PANEL
CHOL/HDL RATIO: 3
Cholesterol: 143 mg/dL (ref 0–200)
HDL: 41.5 mg/dL (ref >39.00)
LDL Cholesterol: 84 mg/dL (ref 0–99)
NONHDL: 101.64
Triglycerides: 89 mg/dL (ref 0.0–149.0)
VLDL: 17.8 mg/dL (ref 0.0–40.0)

## 2017-10-23 LAB — CBC WITH DIFFERENTIAL/PLATELET
BASOS ABS: 0 10*3/uL (ref 0.0–0.1)
Basophils Relative: 0.4 % (ref 0.0–3.0)
EOS ABS: 0.1 10*3/uL (ref 0.0–0.7)
Eosinophils Relative: 1.9 % (ref 0.0–5.0)
HCT: 43.4 % (ref 39.0–52.0)
HEMOGLOBIN: 14.6 g/dL (ref 13.0–17.0)
LYMPHS ABS: 1.8 10*3/uL (ref 0.7–4.0)
Lymphocytes Relative: 40.6 % (ref 12.0–46.0)
MCHC: 33.8 g/dL (ref 30.0–36.0)
MCV: 86.9 fl (ref 78.0–100.0)
MONO ABS: 0.3 10*3/uL (ref 0.1–1.0)
Monocytes Relative: 5.8 % (ref 3.0–12.0)
NEUTROS PCT: 51.3 % (ref 43.0–77.0)
Neutro Abs: 2.3 10*3/uL (ref 1.4–7.7)
Platelets: 162 10*3/uL (ref 150.0–400.0)
RBC: 4.99 Mil/uL (ref 4.22–5.81)
RDW: 12.8 % (ref 11.5–15.5)
WBC: 4.5 10*3/uL (ref 4.0–10.5)

## 2017-10-23 LAB — PSA: PSA: 0.43 ng/mL (ref 0.10–4.00)

## 2017-10-23 LAB — BASIC METABOLIC PANEL
BUN: 18 mg/dL (ref 6–23)
CALCIUM: 9.7 mg/dL (ref 8.4–10.5)
CO2: 24 mEq/L (ref 19–32)
Chloride: 110 mEq/L (ref 96–112)
Creatinine, Ser: 1.39 mg/dL (ref 0.40–1.50)
GFR: 56.22 mL/min — ABNORMAL LOW (ref >60.00)
GLUCOSE: 91 mg/dL (ref 70–99)
Potassium: 3.9 mEq/L (ref 3.5–5.1)
SODIUM: 143 meq/L (ref 135–145)

## 2017-10-23 LAB — TSH: TSH: 1.34 u[IU]/mL (ref 0.35–4.50)

## 2017-10-23 LAB — HEPATIC FUNCTION PANEL
ALT: 26 U/L (ref 0–53)
AST: 19 U/L (ref 0–37)
Albumin: 4.6 g/dL (ref 3.5–5.2)
Alkaline Phosphatase: 53 U/L (ref 39–117)
BILIRUBIN DIRECT: 0.1 mg/dL (ref 0.0–0.3)
BILIRUBIN TOTAL: 0.5 mg/dL (ref 0.2–1.2)
Total Protein: 7.2 g/dL (ref 6.0–8.3)

## 2017-10-23 MED ORDER — FENOFIBRATE 160 MG PO TABS: 160.0000 mg | ORAL_TABLET | Freq: Every day | ORAL | 3 refills | Status: DC

## 2017-10-23 MED ORDER — TRIAMCINOLONE ACETONIDE 55 MCG/ACT NA AERO: 1.0000 | INHALATION_SPRAY | Freq: Every day | NASAL | 12 refills | Status: DC

## 2017-10-23 MED ORDER — OMEPRAZOLE 20 MG PO CPDR: 20.0000 mg | DELAYED_RELEASE_CAPSULE | Freq: Every day | ORAL | 3 refills | Status: DC

## 2017-10-23 NOTE — Assessment & Plan Note (Signed)

## 2017-10-23 NOTE — Progress Notes (Signed)
Subjective:    Patient ID: Jonathan Mills, male    DOB: 09/03/1960, 57 y.o.   MRN: [id number]  HPI  Here for wellness and f/u;  Overall doing ok;  Pt denies Chest pain, worsening SOB, DOE, wheezing, orthopnea, PND, worsening LE edema, palpitations, dizziness or syncope.  Pt denies neurological change such as new headache, facial or extremity weakness.  Pt denies polydipsia, polyuria, or low sugar symptoms. Pt states overall good compliance with treatment and medications, good tolerability, and has been trying to follow appropriate diet.  Pt denies worsening depressive symptoms, suicidal ideation or panic. No fever, night sweats, wt loss, loss of appetite, or other constitutional symptoms.  Pt states good ability with ADL's, has low fall risk, home safety reviewed and adequate, no other significant changes in hearing or vision, and only occasionally active with exercise. Qudexy has resolved his headaches x 6 wks, declines imitrex refill.  No specific other interval hx or new complaints Past Medical History:  Diagnosis Date  . ALLERGIC RHINITIS 10/07/2008  . COLONIC POLYPS, HX OF 10/07/2008  . GERD 10/07/2008  . Headache(784.0)    sinus  . HYPERLIPIDEMIA 10/07/2008  . Hypertriglyceridemia 07/12/2011  . Mood swings   . Vitamin D deficiency 07/12/2011   Past Surgical History:  Procedure Laterality Date  . EYE SURGERY     LASIK    . right ear/neck surgury  1997  . SHOULDER ARTHROSCOPY WITH SUBACROMIAL DECOMPRESSION, ROTATOR CUFF REPAIR AND BICEP TENDON REPAIR Left 12/18/2013   Procedure: LEFT SHOULDER ARTHROSCOPY WITH SUBACROMIAL DECOMPRESSION,DISTAL CLAVICAL RESECTION ROTATOR CUFF REPAIR ;  Surgeon: Marin Shutter, MD;  Location: [facility];  Service: Orthopedics;  Laterality: Left;    reports that he has never smoked. He has never used smokeless tobacco. He reports that he drinks about 0.6 oz of alcohol per week. He reports that he does not use drugs. family history includes Cancer in his father. Allergies    Allergen Reactions  . Penicillins     REACTION: rash   Current Outpatient Medications on File Prior to Visit  Medication Sig Dispense Refill  . SUMAtriptan (IMITREX) 100 MG tablet Take 1 tablet earliest onset of migraine.  May repeat once in 2 hours if headache persists or recurs. 10 tablet 2  . topiramate ER (QUDEXY XR) 50 MG CS24 sprinkle capsule Take 1 capsule (50 mg total) by mouth at bedtime. 30 each 0   No current facility-administered medications on file prior to visit.    Review of Systems  Constitutional: Negative for other unusual diaphoresis or sweats HENT: Negative for ear discharge or swelling Eyes: Negative for other worsening visual disturbances Respiratory: Negative for stridor or other swelling  Gastrointestinal: Negative for worsening distension or other blood Genitourinary: Negative for retention or other urinary change Musculoskeletal: Negative for other MSK pain or swelling Skin: Negative for color change or other new lesions Neurological: Negative for worsening tremors and other numbness  Psychiatric/Behavioral: Negative for worsening agitation or other fatigue All other system neg per pt    Objective:   Physical Exam BP 112/78   Pulse 70   Temp 97.8 F (36.6 C) (Oral)   Ht 6' (1.829 m)   Wt 197 lb (89.4 kg)   SpO2 96%   BMI 26.72 kg/m  VS noted,  Constitutional: Pt is oriented to person, place, and time. Appears well-developed and well-nourished, in no significant distress and comfortable Head: Normocephalic and atraumatic  Eyes: Conjunctivae and EOM are normal. Pupils are equal, round, and  reactive to light Right Ear: External ear normal without discharge Left Ear: External ear normal without discharge Nose: Nose without discharge or deformity Mouth/Throat: Oropharynx is without other ulcerations and moist  Neck: Normal range of motion. Neck supple. No JVD present. No tracheal deviation present or significant neck LA or mass Cardiovascular: Normal  rate, regular rhythm, normal heart sounds and intact distal pulses.   Pulmonary/Chest: WOB normal and breath sounds without rales or wheezing  Abdominal: Soft. Bowel sounds are normal. NT. No HSM  Musculoskeletal: Normal range of motion. Exhibits no edema Lymphadenopathy: Has no other cervical adenopathy.  Neurological: Pt is alert and oriented to person, place, and time. Pt has normal reflexes. No cranial nerve deficit. Motor grossly intact, Gait intact Skin: Skin is warm and dry. No rash noted or new ulcerations Psychiatric:  Has normal mood and affect. Behavior is normal without agitation No other exam findings       Assessment & Plan:

## 2017-10-23 NOTE — Patient Instructions (Addendum)

## 2017-10-24 LAB — HIV ANTIBODY (ROUTINE TESTING W REFLEX): HIV 1&2 Ab, 4th Generation: NONREACTIVE

## 2017-10-25 ENCOUNTER — Other Ambulatory Visit: Payer: Self-pay

## 2017-10-25 ENCOUNTER — Encounter: Payer: Self-pay | Admitting: Neurology

## 2017-10-25 DIAGNOSIS — G43009 Migraine without aura, not intractable, without status migrainosus: Secondary | ICD-10-CM

## 2017-10-25 MED ORDER — DULOXETINE HCL 30 MG PO CPEP: 30.0000 mg | ORAL_CAPSULE | Freq: Every day | ORAL | 3 refills | Status: DC

## 2017-12-24 NOTE — Progress Notes (Signed)
NEUROLOGY FOLLOW UP OFFICE NOTE  Jonathan Mills [id number]  HISTORY OF PRESENT ILLNESS: Jonathan Mills is a 57 year old male who follows up for migraine and tension-type headache.    UPDATE: Topiramate ER was effective but caused adverse side effects, so it was discontinued.  He stopped his mood and antidepressant medications.  He never started the Cymbalta. He has been off these medications for the past 5 to 6 weeks.  He reports headaches have resolved.  He had maybe one headache that was mild, lasting a couple of hours.   Current NSAIDS:  no Current analgesics:  no Current triptans:  Sumatriptan 100mg  Current ergotamine:  no Current anti-emetic:  no Current muscle relaxants:  no Current anti-anxiolytic:  no Current sleep aide:  no Current Antihypertensive medications:  no Current Antidepressant medications:  no Current Anticonvulsant medications:  no Current anti-CGRP:  no Current Vitamins/Herbal/Supplements:  Magnesium citrate 400mg  Current Antihistamines/Decongestants:  no Other therapy:  no Other medication:  no  Caffeine:  2 cups coffee daily Alcohol:  occasional Smoker:  no Diet:  Does not hydrate Exercise:  no Depression:  improved; Anxiety:  improved Other pain:  no Sleep hygiene:  variable  HISTORY: Onset:  History of tension type headache since 20s.  Migraines since 2016. Location:  Unilateral Quality:  sharp Initial Intensity:  9-10/10 Aura:  No Prodrome:  No Postdrome:  No Associated symptoms:  Nausea, photophobia, phonophobia, sometimes vomiting.  No visual disturbance.  She has not had any new worse headache of her life, waking up from sleep Initial Duration:  1 hour intermittently for 2-3 days; Initial Frequency:  1 to 2 times a month Initial Frequency of abortive medication: 1-2 days a week (Tylenol for body aches) Triggers/aggravating factors:  no Relieving factors:  no Activity:  Aggravates  He also has history of chronic tension-type headaches that  have been resolved for several years.  Past NSAIDS:  Ibuprofen, naproxen Past analgesics:  Tylenol Past abortive triptans:  no Past muscle relaxants:  no Past anti-emetic:  no Past antihypertensive medications:  no Past antidepressant medications:  Lexapro, nortriptyline 10mg  (constipation) Past anticonvulsant medications:  lamotrigine, topiramate ER (trouble concentrating) Past vitamins/Herbal/Supplements:  no Other past therapies:  no  Family history of headache:  Mom had migraines Other family history:  Father: brain cancer (possibly GBM), maternal grandfather: Alzheimer's dementia.  Of note, he has endorsed memory deficits in the past.   He underwent neuropsychological testing on 02/12/17, which demonstrated depression but no cognitive impairment.    PAST MEDICAL HISTORY: Past Medical History:  Diagnosis Date  . ALLERGIC RHINITIS 10/07/2008  . COLONIC POLYPS, HX OF 10/07/2008  . GERD 10/07/2008  . Headache(784.0)    sinus  . HYPERLIPIDEMIA 10/07/2008  . Hypertriglyceridemia 07/12/2011  . Mood swings   . Vitamin D deficiency 07/12/2011    MEDICATIONS: Current Outpatient Medications on File Prior to Visit  Medication Sig Dispense Refill  . DULoxetine (CYMBALTA) 30 MG capsule Take 1 capsule (30 mg total) by mouth daily. 30 capsule 3  . fenofibrate 160 MG tablet Take 1 tablet (160 mg total) by mouth daily. 90 tablet 3  . omeprazole (PRILOSEC) 20 MG capsule Take 1 capsule (20 mg total) by mouth daily. 90 capsule 3  . SUMAtriptan (IMITREX) 100 MG tablet Take 1 tablet earliest onset of migraine.  May repeat once in 2 hours if headache persists or recurs. 10 tablet 2  . topiramate ER (QUDEXY XR) 50 MG CS24 sprinkle capsule Take 1 capsule (  50 mg total) by mouth at bedtime. 30 each 0  . triamcinolone (NASACORT ALLERGY 24HR) 55 MCG/ACT AERO nasal inhaler Place 1 spray into the nose daily. 1 Inhaler 12   No current facility-administered medications on file prior to visit.      ALLERGIES: Allergies  Allergen Reactions  . Penicillins     REACTION: rash    FAMILY HISTORY: Family History  Problem Relation Age of Onset  . Cancer Father     SOCIAL HISTORY: Social History   Socioeconomic History  . Marital status: Single    Spouse name: Not on file  . Number of children: Not on file  . Years of education: BS  . Highest education level: Not on file  Occupational History  . Occupation: IT  Social Needs  . Financial resource strain: Not on file  . Food insecurity:    Worry: Not on file    Inability: Not on file  . Transportation needs:    Medical: Not on file    Non-medical: Not on file  Tobacco Use  . Smoking status: Never Smoker  . Smokeless tobacco: Never Used  Substance and Sexual Activity  . Alcohol use: Yes    Alcohol/week: 1.0 standard drinks    Types: 1 Cans of beer per week  . Drug use: No  . Sexual activity: Not on file  Lifestyle  . Physical activity:    Days per week: Not on file    Minutes per session: Not on file  . Stress: Not on file  Relationships  . Social connections:    Talks on phone: Not on file    Gets together: Not on file    Attends religious service: Not on file    Active member of club or organization: Not on file    Attends meetings of clubs or organizations: Not on file    Relationship status: Not on file  . Intimate partner violence:    Fear of current or ex partner: Not on file    Emotionally abused: Not on file    Physically abused: Not on file    Forced sexual activity: Not on file  Other Topics Concern  . 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    REVIEW OF SYSTEMS: Constitutional: No fevers, chills, or sweats, no generalized fatigue, change in appetite Eyes: No visual changes, double vision, eye pain Ear, nose and throat: No hearing loss, ear pain, nasal congestion, sore throat Cardiovascular: No chest pain,  palpitations Respiratory:  No shortness of breath at rest or with exertion, wheezes GastrointestinaI: No nausea, vomiting, diarrhea, abdominal pain, fecal incontinence Genitourinary:  No dysuria, urinary retention or frequency Musculoskeletal:  No neck pain, back pain Integumentary: No rash, pruritus, skin lesions Neurological: as above Psychiatric: No depression, insomnia, anxiety Endocrine: No palpitations, fatigue, diaphoresis, mood swings, change in appetite, change in weight, increased thirst Hematologic/Lymphatic:  No purpura, petechiae. Allergic/Immunologic: no itchy/runny eyes, nasal congestion, recent allergic reactions, rashes  PHYSICAL EXAM: Blood pressure 112/76, pulse 71, height 6' (1.829 m), weight 202 lb (91.6 kg), SpO2 97 %. General: No acute distress.  Patient appears well-groomed.  Head:  Normocephalic/atraumatic Eyes:  Fundi examined but not visualized Neck: supple, no paraspinal tenderness, full range of motion Heart:  Regular rate and rhythm Lungs:  Clear to auscultation bilaterally Back: No paraspinal tenderness Neurological Exam: alert and oriented to person, place, and time. Attention span and concentration  intact, recent and remote memory intact, fund of knowledge intact.  Speech fluent and not dysarthric, language intact.  CN II-XII intact. Bulk and tone normal, muscle strength 5/5 throughout.  Sensation to light touch, temperature and vibration intact.  Deep tendon reflexes 2+ throughout, toes downgoing.  Finger to nose and heel to shin testing intact.  Gait normal, Romberg negative.  IMPRESSION: Migraine without aura, not intractable, without status migrainosus Tension-type headache, not intractable  PLAN: 1.  Continue magnesium citrate 400mg  daily 2.  Limit pain relievers to no more than 2 days out of week to prevent rebound headache. 3.  Keep headache diary 4.  Follow up in 4 months.  Metta Clines, DO  CC: Cathlean Cower, MD

## 2017-12-25 ENCOUNTER — Encounter: Payer: Self-pay | Admitting: Neurology

## 2017-12-25 ENCOUNTER — Ambulatory Visit (INDEPENDENT_AMBULATORY_CARE_PROVIDER_SITE_OTHER): Payer: 59 | Admitting: Neurology

## 2017-12-25 VITALS — BP 112/76 | HR 71 | Ht 72.0 in | Wt 202.0 lb

## 2017-12-25 DIAGNOSIS — G43009 Migraine without aura, not intractable, without status migrainosus: Secondary | ICD-10-CM | POA: Diagnosis not present

## 2017-12-25 NOTE — Patient Instructions (Signed)
Continue current regimen Take magnesium citrate 400mg  daily Limit use of pain relievers to no more than 2 days out of week to prevent medication overuse headache Keep headache diary Follow up in 4 months

## 2018-01-07 ENCOUNTER — Encounter: Payer: Self-pay | Admitting: Internal Medicine

## 2018-01-07 ENCOUNTER — Ambulatory Visit (INDEPENDENT_AMBULATORY_CARE_PROVIDER_SITE_OTHER): Payer: 59 | Admitting: Internal Medicine

## 2018-01-07 VITALS — BP 118/76 | HR 70 | Temp 98.0°F | Ht 72.0 in | Wt 203.0 lb

## 2018-01-07 DIAGNOSIS — R059 Cough, unspecified: Secondary | ICD-10-CM | POA: Insufficient documentation

## 2018-01-07 DIAGNOSIS — J309 Allergic rhinitis, unspecified: Secondary | ICD-10-CM | POA: Diagnosis not present

## 2018-01-07 DIAGNOSIS — R05 Cough: Secondary | ICD-10-CM

## 2018-01-07 DIAGNOSIS — F419 Anxiety disorder, unspecified: Secondary | ICD-10-CM | POA: Diagnosis not present

## 2018-01-07 MED ORDER — AZITHROMYCIN 250 MG PO TABS: ORAL_TABLET | ORAL | 1 refills | Status: DC

## 2018-01-07 NOTE — Assessment & Plan Note (Signed)
stable overall by history and exam, and pt to continue medical treatment as before,  to f/u any worsening symptoms or concerns 

## 2018-01-07 NOTE — Progress Notes (Signed)
Subjective:    Patient ID: Jonathan Mills, male    DOB: [birth year], 57 y.o.   MRN: [id number]  HPI    Here with acute onset mild to mod 2-3 days ST, HA, general weakness and malaise, with prod cough greenish sputum, but Pt denies chest pain, increased sob or doe, wheezing, orthopnea, PND, increased LE swelling, palpitations, dizziness or syncope.  Pt denies new neurological symptoms such as new headache, or facial or extremity weakness or numbness   Pt denies polydipsia, polyuria.  Allergy symptoms without increase, and Denies worsening depressive symptoms, suicidal ideation, or panic Past Medical History:  Diagnosis Date  . ALLERGIC RHINITIS 10/07/2008  . COLONIC POLYPS, HX OF 10/07/2008  . GERD 10/07/2008  . Headache(784.0)    sinus  . HYPERLIPIDEMIA 10/07/2008  . Hypertriglyceridemia 07/12/2011  . Mood swings   . Vitamin D deficiency 07/12/2011   Past Surgical History:  Procedure Laterality Date  . EYE SURGERY     LASIK    . right ear/neck surgury  1997  . SHOULDER ARTHROSCOPY WITH SUBACROMIAL DECOMPRESSION, ROTATOR CUFF REPAIR AND BICEP TENDON REPAIR Left 12/18/2013   Procedure: LEFT SHOULDER ARTHROSCOPY WITH SUBACROMIAL DECOMPRESSION,DISTAL CLAVICAL RESECTION ROTATOR CUFF REPAIR ;  Surgeon: Marin Shutter, MD;  Location: [facility];  Service: Orthopedics;  Laterality: Left;    reports that he has never smoked. He has never used smokeless tobacco. He reports that he drinks about 1.0 standard drinks of alcohol per week. He reports that he does not use drugs. family history includes Cancer in his father. Allergies  Allergen Reactions  . Penicillins     REACTION: rash   Current Outpatient Medications on File Prior to Visit  Medication Sig Dispense Refill  . fenofibrate 160 MG tablet Take 1 tablet (160 mg total) by mouth daily. 90 tablet 3  . omeprazole (PRILOSEC) 20 MG capsule Take 1 capsule (20 mg total) by mouth daily. 90 capsule 3  . SUMAtriptan (IMITREX) 100 MG tablet Take 1 tablet earliest  onset of migraine.  May repeat once in 2 hours if headache persists or recurs. 10 tablet 2  . triamcinolone (NASACORT ALLERGY 24HR) 55 MCG/ACT AERO nasal inhaler Place 1 spray into the nose daily. 1 Inhaler 12   No current facility-administered medications on file prior to visit.    Review of Systems  Constitutional: Negative for other unusual diaphoresis or sweats HENT: Negative for ear discharge or swelling Eyes: Negative for other worsening visual disturbances Respiratory: Negative for stridor or other swelling  Gastrointestinal: Negative for worsening distension or other blood Genitourinary: Negative for retention or other urinary change Musculoskeletal: Negative for other MSK pain or swelling Skin: Negative for color change or other new lesions Neurological: Negative for worsening tremors and other numbness  Psychiatric/Behavioral: Negative for worsening agitation or other fatigue All other system neg per pt    Objective:   Physical Exam BP 118/76   Pulse 70   Temp 98 F (36.7 C) (Oral)   Ht 6' (1.829 m)   Wt 203 lb (92.1 kg)   SpO2 97%   BMI 27.53 kg/m  VS noted, mild ill Constitutional: Pt appears in NAD HENT: Head: NCAT.  Right Ear: External ear normal.  Left Ear: External ear normal.  Eyes: . Pupils are equal, round, and reactive to light. Conjunctivae and EOM are normal Nose: without d/c or deformity Neck: Neck supple. Gross normal ROM Bilat tm's with mild erythema.  Max sinus areas non tender.  Pharynx with mild erythema,  no exudate Cardiovascular: Normal rate and regular rhythm.   Pulmonary/Chest: Effort normal and breath sounds decreased without rales or wheezing.  Neurological: Pt is alert. At baseline orientation, motor grossly intact Skin: Skin is warm. No rashes, other new lesions, no LE edema Psychiatric: Pt behavior is normal without agitation  No other exam findings    Assessment & Plan:

## 2018-01-07 NOTE — Assessment & Plan Note (Addendum)
stable overall by history and exam, and pt to continue medical treatment as before,  to f/u any worsening symptoms or concerns 

## 2018-01-07 NOTE — Assessment & Plan Note (Signed)
Mild to mod, c./w bronchitis vs pna, for antibx course, delsym otc prn cough, mucinex otc prn, to f/u any worsening symptoms or concerns

## 2018-01-07 NOTE — Patient Instructions (Signed)
Please take all new medication as prescribed - the antibiotic  You can also take Delsym OTC for cough, and/or Mucinex (or it's generic off brand) for congestion, and tylenol as needed for pain.  Please continue all other medications as before, and refills have been done if requested.  Please have the pharmacy call with any other refills you may need.  Please keep your appointments with your specialists as you may have planned    

## 2018-02-27 ENCOUNTER — Encounter: Payer: Self-pay | Admitting: Family

## 2018-02-27 ENCOUNTER — Other Ambulatory Visit (INDEPENDENT_AMBULATORY_CARE_PROVIDER_SITE_OTHER): Payer: 59

## 2018-02-27 ENCOUNTER — Ambulatory Visit (INDEPENDENT_AMBULATORY_CARE_PROVIDER_SITE_OTHER): Payer: 59 | Admitting: Family

## 2018-02-27 VITALS — BP 114/78 | HR 62 | Temp 97.9°F | Ht 72.0 in | Wt 203.1 lb

## 2018-02-27 DIAGNOSIS — R5383 Other fatigue: Secondary | ICD-10-CM

## 2018-02-27 DIAGNOSIS — R519 Headache, unspecified: Secondary | ICD-10-CM

## 2018-02-27 DIAGNOSIS — R51 Headache: Secondary | ICD-10-CM | POA: Diagnosis not present

## 2018-02-27 LAB — CBC WITH DIFFERENTIAL/PLATELET
BASOS ABS: 0 10*3/uL (ref 0.0–0.1)
Basophils Relative: 0.6 % (ref 0.0–3.0)
EOS ABS: 0.1 10*3/uL (ref 0.0–0.7)
Eosinophils Relative: 1.5 % (ref 0.0–5.0)
HEMATOCRIT: 42.4 % (ref 39.0–52.0)
HEMOGLOBIN: 14.3 g/dL (ref 13.0–17.0)
LYMPHS PCT: 43.3 % (ref 12.0–46.0)
Lymphs Abs: 1.8 10*3/uL (ref 0.7–4.0)
MCHC: 33.7 g/dL (ref 30.0–36.0)
MCV: 87.2 fl (ref 78.0–100.0)
MONOS PCT: 11.7 % (ref 3.0–12.0)
Monocytes Absolute: 0.5 10*3/uL (ref 0.1–1.0)
NEUTROS ABS: 1.7 10*3/uL (ref 1.4–7.7)
Neutrophils Relative %: 42.9 % — ABNORMAL LOW (ref 43.0–77.0)
Platelets: 148 10*3/uL — ABNORMAL LOW (ref 150.0–400.0)
RBC: 4.87 Mil/uL (ref 4.22–5.81)
RDW: 12.8 % (ref 11.5–15.5)
WBC: 4 10*3/uL (ref 4.0–10.5)

## 2018-02-27 LAB — COMPREHENSIVE METABOLIC PANEL
ALBUMIN: 4.6 g/dL (ref 3.5–5.2)
ALT: 29 U/L (ref 0–53)
AST: 19 U/L (ref 0–37)
Alkaline Phosphatase: 55 U/L (ref 39–117)
BUN: 18 mg/dL (ref 6–23)
CALCIUM: 9.4 mg/dL (ref 8.4–10.5)
CO2: 28 meq/L (ref 19–32)
Chloride: 106 mEq/L (ref 96–112)
Creatinine, Ser: 1.24 mg/dL (ref 0.40–1.50)
GFR: 64.05 mL/min (ref >60.00)
Glucose, Bld: 81 mg/dL (ref 70–99)
Potassium: 4 mEq/L (ref 3.5–5.1)
Sodium: 140 mEq/L (ref 135–145)
Total Bilirubin: 0.4 mg/dL (ref 0.2–1.2)
Total Protein: 7.6 g/dL (ref 6.0–8.3)

## 2018-02-27 LAB — C-REACTIVE PROTEIN: CRP: 0.2 mg/dL — ABNORMAL LOW (ref 0.5–20.0)

## 2018-02-27 LAB — SEDIMENTATION RATE: SED RATE: 7 mm/h (ref 0–20)

## 2018-02-27 LAB — VITAMIN D 25 HYDROXY (VIT D DEFICIENCY, FRACTURES): VITD: 53.66 ng/mL (ref 30.00–100.00)

## 2018-02-27 LAB — TSH: TSH: 1.44 u[IU]/mL (ref 0.35–4.50)

## 2018-02-27 LAB — VITAMIN B12: VITAMIN B 12: 364 pg/mL (ref 211–911)

## 2018-02-27 NOTE — Progress Notes (Signed)
Jonathan Mills is a 57 y.o. male with the following history as recorded in [employer]:  Patient Active Problem List   Diagnosis Date Noted  . Cough 01/07/2018  . Skin lesion 10/15/2016  . Headache 10/13/2016  . Pain in lower back 10/28/2015  . Rash and nonspecific skin eruption 10/28/2014  . Memory difficulty 10/28/2014  . Anxiety 10/28/2014  . Acute upper respiratory infection 09/29/2014  . Acute sinus infection 08/14/2014  . Anxiety state 10/03/2012  . Vitamin D deficiency 07/12/2011  . Hypertriglyceridemia 07/12/2011  . Right thyroid nodule 07/12/2011  . Preventative health care 07/08/2011  . FATIGUE 06/21/2010  . Hyperlipidemia 10/07/2008  . Allergic rhinitis 10/07/2008  . GERD 10/07/2008  . COLONIC POLYPS, HX OF 10/07/2008    Current Outpatient Medications  Medication Sig Dispense Refill  . fenofibrate 160 MG tablet Take 1 tablet (160 mg total) by mouth daily. 90 tablet 3  . omeprazole (PRILOSEC) 20 MG capsule Take 1 capsule (20 mg total) by mouth daily. 90 capsule 3  . SUMAtriptan (IMITREX) 100 MG tablet Take 1 tablet earliest onset of migraine.  May repeat once in 2 hours if headache persists or recurs. 10 tablet 2  . triamcinolone (NASACORT ALLERGY 24HR) 55 MCG/ACT AERO nasal inhaler Place 1 spray into the nose daily. 1 Inhaler 12   No current facility-administered medications for this visit.     Allergies: Penicillins  Past Medical History:  Diagnosis Date  . ALLERGIC RHINITIS 10/07/2008  . COLONIC POLYPS, HX OF 10/07/2008  . GERD 10/07/2008  . Headache(784.0)    sinus  . HYPERLIPIDEMIA 10/07/2008  . Hypertriglyceridemia 07/12/2011  . Mood swings   . Vitamin D deficiency 07/12/2011    Past Surgical History:  Procedure Laterality Date  . EYE SURGERY     LASIK    . right ear/neck surgury  1997  . SHOULDER ARTHROSCOPY WITH SUBACROMIAL DECOMPRESSION, ROTATOR CUFF REPAIR AND BICEP TENDON REPAIR Left 12/18/2013   Procedure: LEFT SHOULDER ARTHROSCOPY WITH SUBACROMIAL  DECOMPRESSION,DISTAL CLAVICAL RESECTION ROTATOR CUFF REPAIR ;  Surgeon: Marin Shutter, MD;  Location: [facility];  Service: Orthopedics;  Laterality: Left;    Family History  Problem Relation Age of Onset  . Cancer Father     Social History   Tobacco Use  . Smoking status: Never Smoker  . Smokeless tobacco: Never Used  Substance Use Topics  . Alcohol use: Yes    Alcohol/week: 1.0 standard drinks    Types: 1 Cans of beer per week    Subjective:  Patient has diagnosed history of cluster headaches; notes that his cluster headache cycle can last about 1-2 weeks; felts like he started with cluster headache last week- by Saturday, opted to take Imitrex to try and help with the symptoms; notes however on Sunday, he woke up just "feeling tired and blah." States that he has been out of work for the past few days with the fatigue; is here today "just to make sure nothing else is causing his symptoms" ; denies any cold symptoms, no sore throat, no fever; no chest pain, no shortness of breath; denies any rash, recent travel or tick bites; no changes in bowel movements or urinary habits; appetite has been normal; notes that family members are all well; has not contacted his neurologist about his concerns about this particular cluster headache episode. Has never had a reaction to Imitrex like this in the past; is feeling better today compared to yesterday.   Objective:  Vitals:   02/27/18 1044  BP: 114/78  Pulse: 62  Temp: 97.9 F (36.6 C)  TempSrc: Oral  SpO2: 97%  Weight: 203 lb 1.9 oz (92.1 kg)  Height: 6' (1.829 m)    General: Well developed, well nourished, in no acute distress  Skin : Warm and dry.  Head: Normocephalic and atraumatic  Eyes: Sclera and conjunctiva clear; pupils round and reactive to light; extraocular movements intact  Ears: External normal; canals clear; tympanic membranes normal  Oropharynx: Pink, supple. No suspicious lesions  Neck: Supple without thyromegaly, adenopathy   Lungs: Respirations unlabored; clear to auscultation bilaterally without wheeze, rales, rhonchi  CVS exam: normal rate and regular rhythm.  Musculoskeletal: No deformities; no active joint inflammation  Extremities: No edema, cyanosis, clubbing  Vessels: Symmetric bilaterally  Neurologic: Alert and oriented; speech intact; face symmetrical; moves all extremities well; CNII-XII intact without focal deficit   Assessment:  1. Fatigue, unspecified type   2. Nonintractable headache, unspecified chronicity pattern, unspecified headache type     Plan:  ? Etiology; update EKG to rule out cardiac source- no acute changes seen; will update labs but suspect they will be of low yield; Have encouraged patient to follow-up with his neurologist; did also reach out to neurologist about patient's symptoms; MRI done in January 2019 raised question of MS? Follow-up to be determined.   No follow-ups on file.  Orders Placed This Encounter  Procedures  . CBC w/Diff    Standing Status:   Future    Number of Occurrences:   1    Standing Expiration Date:   02/27/2019  . Comp Met (CMET)    Standing Status:   Future    Number of Occurrences:   1    Standing Expiration Date:   02/27/2019  . TSH    Standing Status:   Future    Number of Occurrences:   1    Standing Expiration Date:   02/27/2019  . Vitamin D (25 hydroxy)    Standing Status:   Future    Number of Occurrences:   1    Standing Expiration Date:   02/27/2019  . B12    Standing Status:   Future    Number of Occurrences:   1    Standing Expiration Date:   02/27/2019  . C-reactive protein    Standing Status:   Future    Number of Occurrences:   1    Standing Expiration Date:   02/27/2019  . Sed Rate (ESR)    Standing Status:   Future    Number of Occurrences:   1    Standing Expiration Date:   02/27/2019  . EKG 12-Lead    Requested Prescriptions    No prescriptions requested or ordered in this encounter

## 2018-05-14 NOTE — Progress Notes (Signed)
NEUROLOGY FOLLOW UP OFFICE NOTE  [name] [id number]  HISTORY OF PRESENT ILLNESS: Jonathan Mills. [place] is a 57 year old male who follows up for migraine and tension type headache.  UPDATE: Following stopping his mood and antidepressant medications over the summer, he stated that his headaches had resolved.  He began having increased headaches in September for a week and a half.  Then in October, he had two and half weeks of consecutive headaches.  He missed work for 3 days.  November and December he has had no headaches.   Frequency of abortive medication: variable month to month Current NSAIDS:  none Current analgesics:  none Current triptans: Sumatriptan 100 mg Current ergotamine: None Current anti-emetic: None Current muscle relaxants: None Current anti-anxiolytic: None Current sleep aide: None Current Antihypertensive medications: None Current Antidepressant medications: None Current Anticonvulsant medications: None Current anti-CGRP: None Current Vitamins/Herbal/Supplements: Magnesium citrate 400 mg Current Antihistamines/Decongestants: None Other therapy: None  Caffeine: 2 cups of coffee daily Alcohol: Occasional Smoker: No Diet:  hydrates Exercise: No Depression: Improved; Anxiety: Proved Other pain: No Sleep hygiene: Able  HISTORY:  Onset: History of tension type headaches since 20s.  Migraine since 2016. Location: Unilateral Quality: sharp Initial Intensity: 9-10/10 Aura: No Prodrome: No Postdrome: No Associated symptoms: Nausea, photophobia, phonophobia, sometimes vomiting, tinnitus, nasal congestion/runny nose.  No visual disturbance, ptosis or conjunctival injection. She has not had any new worse headache of her life, waking up from sleep Initial Duration: 1 hour intermittently for 2-3 days; Initial Frequency: 1 to 2 times a month Initial Frequency of abortive medication: 1-2 days a week (Tylenol for body aches) Triggers: possibly alcohol at  times. Relieving factors:  no Activity: Aggravates  He also has history of chronic tension-type headaches that have been resolved for several years.  Past NSAIDS: Ibuprofen, naproxen Past analgesics: Tylenol Past abortive triptans: no Past muscle relaxants: no Past anti-emetic: no Past antihypertensive medications: no Past antidepressant medications: Lexapro, nortriptyline 10mg  (constipation) Past anticonvulsant medications:lamotrigine, topiramate ER (trouble concentrating) Past vitamins/Herbal/Supplements: no Other past therapies: no  Family history of headache: Mom had migraines Other family history: Father: brain cancer (possibly GBM), maternal grandfather: Alzheimer's dementia.  Of note, he has endorsed memory deficits in the past. He underwent neuropsychological testing on 02/12/17, which demonstrated depression but no cognitive impairment.   PAST MEDICAL HISTORY: Past Medical History:  Diagnosis Date  . ALLERGIC RHINITIS 10/07/2008  . COLONIC POLYPS, HX OF 10/07/2008  . GERD 10/07/2008  . Headache(784.0)    sinus  . HYPERLIPIDEMIA 10/07/2008  . Hypertriglyceridemia 07/12/2011  . Mood swings   . Vitamin D deficiency 07/12/2011    MEDICATIONS: Current Outpatient Medications on File Prior to Visit  Medication Sig Dispense Refill  . fenofibrate 160 MG tablet Take 1 tablet (160 mg total) by mouth daily. 90 tablet 3  . omeprazole (PRILOSEC) 20 MG capsule Take 1 capsule (20 mg total) by mouth daily. 90 capsule 3  . SUMAtriptan (IMITREX) 100 MG tablet Take 1 tablet earliest onset of migraine.  May repeat once in 2 hours if headache persists or recurs. 10 tablet 2  . triamcinolone (NASACORT ALLERGY 24HR) 55 MCG/ACT AERO nasal inhaler Place 1 spray into the nose daily. 1 Inhaler 12   No current facility-administered medications on file prior to visit.     ALLERGIES: Allergies  Allergen Reactions  . Penicillins     REACTION: rash    FAMILY  HISTORY: Family History  Problem Relation Age of Onset  . Cancer Father  SOCIAL HISTORY: Social History   Socioeconomic History  . Marital status: Single    Spouse name: Not on file  . Number of children: Not on file  . Years of education: BS  . Highest education level: Not on file  Occupational History  . Occupation: IT  Social Needs  . Financial resource strain: Not on file  . Food insecurity:    Worry: Not on file    Inability: Not on file  . Transportation needs:    Medical: Not on file    Non-medical: Not on file  Tobacco Use  . Smoking status: Never Smoker  . Smokeless tobacco: Never Used  Substance and Sexual Activity  . Alcohol use: Yes    Alcohol/week: 1.0 standard drinks    Types: 1 Cans of beer per week  . Drug use: No  . Sexual activity: Not on file  Lifestyle  . Physical activity:    Days per week: Not on file    Minutes per session: Not on file  . Stress: Not on file  Relationships  . Social connections:    Talks on phone: Not on file    Gets together: Not on file    Attends religious service: Not on file    Active member of club or organization: Not on file    Attends meetings of clubs or organizations: Not on file    Relationship status: Not on file  . Intimate partner violence:    Fear of current or ex partner: Not on file    Emotionally abused: Not on file    Physically abused: Not on file    Forced sexual activity: Not on file  Other Topics Concern  . 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    REVIEW OF SYSTEMS: Constitutional: No fevers, chills, or sweats, no generalized fatigue, change in appetite Eyes: No visual changes, double vision, eye pain Ear, nose and throat: No hearing loss, ear pain, nasal congestion, sore throat Cardiovascular: No chest pain, palpitations Respiratory:  No shortness of breath at rest or with exertion, wheezes GastrointestinaI: No nausea, vomiting,  diarrhea, abdominal pain, fecal incontinence Genitourinary:  No dysuria, urinary retention or frequency Musculoskeletal:  No neck pain, back pain Integumentary: No rash, pruritus, skin lesions Neurological: as above Psychiatric: No depression, insomnia, anxiety Endocrine: No palpitations, fatigue, diaphoresis, mood swings, change in appetite, change in weight, increased thirst Hematologic/Lymphatic:  No purpura, petechiae. Allergic/Immunologic: no itchy/runny eyes, nasal congestion, recent allergic reactions, rashes  PHYSICAL EXAM: Blood pressure 108/70, pulse 61, height 6' (1.829 m), weight 206 lb (93.4 kg), SpO2 98 %. General: No acute distress.  Patient appears well-groomed.  Head:  Normocephalic/atraumatic Eyes:  Fundi examined but not visualized Neck: supple, no paraspinal tenderness, full range of motion Heart:  Regular rate and rhythm Lungs:  Clear to auscultation bilaterally Back: No paraspinal tenderness Neurological Exam: alert and oriented to person, place, and time. Attention span and concentration intact, recent and remote memory intact, fund of knowledge intact.  Speech fluent and not dysarthric, language intact.  CN II-XII intact. Bulk and tone normal, muscle strength 5/5 throughout.  Sensation to light touch  intact.  Deep tendon reflexes 2+ throughout.  Finger to nose testing intact.  Gait normal, Romberg negative.  IMPRESSION: 1.  Migraine without aura, without status migrainosus, not intractable.  Also possibly episodic cluster headache. 2.  Tension type headache, not intractable  PLAN: We  will hold off on starting any medication since he has been doing well over past 2 months.  If he has another intractable migraine lasting 1 to 2 weeks, we can start a prednisone taper and start verapamil at that time (80mg  three times daily).  He will take sumatriptan 100mg  as needed.  Metta Clines, DO  CC: Cathlean Cower, MD

## 2018-05-16 ENCOUNTER — Encounter: Payer: Self-pay | Admitting: Neurology

## 2018-05-16 ENCOUNTER — Ambulatory Visit (INDEPENDENT_AMBULATORY_CARE_PROVIDER_SITE_OTHER): Payer: 59 | Admitting: Neurology

## 2018-05-16 VITALS — BP 108/70 | HR 61 | Ht 72.0 in | Wt 206.0 lb

## 2018-05-16 DIAGNOSIS — G43009 Migraine without aura, not intractable, without status migrainosus: Secondary | ICD-10-CM

## 2018-05-16 NOTE — Patient Instructions (Addendum)
Migraine vs cluster headaches.  If you get another cluster of daily headaches, contact me and we can start a prednisone taper.  We would also start verapamil to take daily (as a preventative)  Follow up in 6 months.

## 2018-09-27 ENCOUNTER — Telehealth: Payer: Self-pay

## 2018-09-27 NOTE — Telephone Encounter (Signed)
Dr. Rogers Blocker, please advise on approval or denial of transfer.

## 2018-09-27 NOTE — Telephone Encounter (Signed)
Ok with me, thanks.

## 2018-09-27 NOTE — Telephone Encounter (Signed)
Copied from [place] [id number]. Topic: Appointment Scheduling - Transfer of Care >> Sep 27, 2018 11:56 AM Richardo Priest, NT wrote: Pt is requesting to transfer FROM: Jonathan Mills with Dr.John Pt is requesting to transfer TO: [place] with [facility] Reason for requested transfer: Patient is requesting the transfer due to driving distance.  Send CRM to patient's current PCP (transferring FROM).  Call back is [phone]. >> Sep 27, 2018 12:11 PM Para Skeans A wrote: Dr.John are you okay with this?

## 2018-09-30 NOTE — Telephone Encounter (Signed)
I called and left a voicemail to schedule a TOC appointment to Dr. Rogers Blocker. No need to route message.

## 2018-09-30 NOTE — Telephone Encounter (Signed)
Fine with me, just let him know i'll be on maternity leave for 3 months starting June 5th

## 2018-10-04 ENCOUNTER — Other Ambulatory Visit: Payer: Self-pay | Admitting: Neurology

## 2018-10-04 MED ORDER — SUMATRIPTAN SUCCINATE 25 MG PO TABS: ORAL_TABLET | ORAL | 3 refills | Status: DC

## 2018-10-14 ENCOUNTER — Telehealth: Payer: Self-pay | Admitting: *Deleted

## 2018-10-14 NOTE — Telephone Encounter (Signed)
Ok with me 

## 2018-10-14 NOTE — Telephone Encounter (Signed)
Copied from [place] [id number]. Topic: Appointment Scheduling - Transfer of Care >> Sep 27, 2018 11:56 AM Richardo Priest, NT wrote: Pt is requesting to transfer FROM: Shelba Flake with Dr.John Pt is requesting to transfer TO: [place] with [facility] Reason for requested transfer: Patient is requesting the transfer due to driving distance.  Send CRM to patient's current PCP (transferring FROM).  Call back is [phone]. >> Sep 27, 2018 12:11 PM Para Skeans A wrote: Dr.John are you okay with this? >> Oct 14, 2018  2:12 PM Carolyn Stare wrote:   Pt is still waiting on a call back to verify his transfer is ok

## 2018-10-15 NOTE — Telephone Encounter (Signed)
Please Advise

## 2018-10-16 NOTE — Telephone Encounter (Signed)
Have already answered this. Okay with me, but just let him know may not get him in before maternity leave.  Orma Flaming, MD [place]

## 2018-10-30 ENCOUNTER — Encounter: Payer: Self-pay | Admitting: Family Medicine

## 2018-10-30 ENCOUNTER — Ambulatory Visit (INDEPENDENT_AMBULATORY_CARE_PROVIDER_SITE_OTHER): Payer: 59 | Admitting: Family Medicine

## 2018-10-30 ENCOUNTER — Other Ambulatory Visit: Payer: Self-pay

## 2018-10-30 VITALS — BP 126/84 | HR 68 | Temp 97.9°F | Ht 72.0 in | Wt 191.2 lb

## 2018-10-30 DIAGNOSIS — N529 Male erectile dysfunction, unspecified: Secondary | ICD-10-CM | POA: Diagnosis not present

## 2018-10-30 MED ORDER — VARDENAFIL HCL 10 MG PO TABS: 10.0000 mg | ORAL_TABLET | Freq: Every day | ORAL | 5 refills | Status: DC | PRN

## 2018-10-30 NOTE — Progress Notes (Signed)
Subjective  CC:  Chief Complaint  Patient presents with  . Erectile Dysfunction    x 1 month    HPI: Jonathan Mills is a 58 y.o. male who presents to the office today to address the problems listed above in the chief complaint.  58 year old male who is transferring his care to Dr. Eliberto Ivory when she returns presents due to erectile dysfunction.  He is an overall healthy 58 year old who recently started in a new relationship.  Currently having trouble maintaining erections.  Libido is good.  Relationship is excellent and new.  He did suffer from erectile dysfunction many years ago prior to getting a divorce.  At that time they used Viagra, it helped however gave him severe headaches.  He would like to try a different option today.  He has no problems with fatigue, mood disorders, he is not on antidepressants or antihypertensives.  He has no cardiac disease.  He does not take nitrates. Assessment  1. Vasculogenic erectile dysfunction, unspecified vasculogenic erectile dysfunction type      Plan   ED: Educated on possible etiologies and treatment options.  He is a good candidate for the 5 phosphodiesterase inhibitors.  Will try Levitra as it has a less likelihood of precipitating headaches.  He will follow-up if he has adverse effects or if it is ineffective.  We would initiate further work-up at that time.  We may need prior authorization.  He has failed Viagra in the past.  Follow up: Establish care with Dr. Eliberto Ivory 12/23/2018  No orders of the defined types were placed in this encounter.  Meds ordered this encounter  Medications  . vardenafil (LEVITRA) 10 MG tablet    Sig: Take 1 tablet (10 mg total) by mouth daily as needed for erectile dysfunction.    Dispense:  10 tablet    Refill:  5    Pt has failed viagra in the past due to headaches. Please start PA if needed.      I reviewed the patients updated PMH, FH, and SocHx.    Patient Active Problem List   Diagnosis Date Noted  . Cough  01/07/2018  . Skin lesion 10/15/2016  . Headache 10/13/2016  . Pain in lower back 10/28/2015  . Rash and nonspecific skin eruption 10/28/2014  . Memory difficulty 10/28/2014  . Anxiety 10/28/2014  . Acute upper respiratory infection 09/29/2014  . Acute sinus infection 08/14/2014  . Anxiety state 10/03/2012  . Vitamin D deficiency 07/12/2011  . Hypertriglyceridemia 07/12/2011  . Right thyroid nodule 07/12/2011  . Preventative health care 07/08/2011  . FATIGUE 06/21/2010  . Hyperlipidemia 10/07/2008  . Allergic rhinitis 10/07/2008  . GERD 10/07/2008  . COLONIC POLYPS, HX OF 10/07/2008   Current Meds  Medication Sig  . Apoaequorin (PREVAGEN EXTRA STRENGTH PO) Take 1 tablet by mouth daily.  . fenofibrate 160 MG tablet Take 1 tablet (160 mg total) by mouth daily.  . magnesium 30 MG tablet Take 30 mg by mouth 2 (two) times daily.  Marland Kitchen omeprazole (PRILOSEC) 20 MG capsule Take 1 capsule (20 mg total) by mouth daily.  . SUMAtriptan (IMITREX) 25 MG tablet Take 1 tablet earliest onset of migraine.  May repeat in 2 hours if headache persists or recurs.  Maximum 2 tablets in 24 hours    Allergies: Patient is allergic to penicillins. Family History: Patient family history includes Cancer in his father. Social History:  Patient  reports that he has never smoked. He has never used smokeless tobacco. He  reports current alcohol use of about 1.0 standard drinks of alcohol per week. He reports that he does not use drugs.  Review of Systems: Constitutional: Negative for fever malaise or anorexia Cardiovascular: negative for chest pain Respiratory: negative for SOB or persistent cough Gastrointestinal: negative for abdominal pain  Objective  Vitals: BP 126/84 (BP Location: Left Arm, Patient Position: Sitting, Cuff Size: Normal)   Pulse 68   Temp 97.9 F (36.6 C) (Oral)   Ht 6' (1.829 m)   Wt 191 lb 3.2 oz (86.7 kg)   SpO2 98%   BMI 25.93 kg/m  General: no acute distress , A&Ox3 HEENT:  PEERL, conjunctiva normal, Oropharynx moist,neck is supple Cardiovascular:  RRR without murmur or gallop.  Respiratory:  Good breath sounds bilaterally, CTAB with normal respiratory effort    Commons side effects, risks, benefits, and alternatives for medications and treatment plan prescribed today were discussed, and the patient expressed understanding of the given instructions. Patient is instructed to call or message via MyChart if he/she has any questions or concerns regarding our treatment plan. No barriers to understanding were identified. We discussed Red Flag symptoms and signs in detail. Patient expressed understanding regarding what to do in case of urgent or emergency type symptoms.   Medication list was reconciled, printed and provided to the patient in AVS. Patient instructions and summary information was reviewed with the patient as documented in the AVS. This note was prepared with assistance of Dragon voice recognition software. Occasional wrong-word or sound-a-like substitutions may have occurred due to the inherent limitations of voice recognition software

## 2018-10-30 NOTE — Patient Instructions (Signed)
Please follow up as scheduled for your next visit with Dr. Rogers Blocker.   If you have any questions or concerns, please don't hesitate to send me a message via MyChart or call the office at [phone]. Thank you for visiting with [place] today! It's our pleasure caring for you.   Erectile Dysfunction Erectile dysfunction (ED) is the inability to get or keep an erection in order to have sexual intercourse. Erectile dysfunction may include:  Inability to get an erection.  Lack of enough hardness of the erection to allow penetration.  Loss of the erection before sex is finished. What are the causes? This condition may be caused by:  Certain medicines, such as: ? Pain relievers. ? Antihistamines. ? Antidepressants. ? Blood pressure medicines. ? Water pills (diuretics). ? Ulcer medicines. ? Muscle relaxants. ? Drugs.  Excessive drinking.  Psychological causes, such as: ? Anxiety. ? Depression. ? Sadness. ? Exhaustion. ? Performance fear. ? Stress.  Physical causes, such as: ? Artery problems. This may include diabetes, smoking, liver disease, or atherosclerosis. ? High blood pressure. ? Hormonal problems, such as low testosterone. ? Obesity. ? Nerve problems. This may include back or pelvic injuries, diabetes mellitus, multiple sclerosis, or Parkinson disease. What are the signs or symptoms? Symptoms of this condition include:  Inability to get an erection.  Lack of enough hardness of the erection to allow penetration.  Loss of the erection before sex is finished.  Normal erections at some times, but with frequent unsatisfactory episodes.  Low sexual satisfaction in either partner due to erection problems.  A curved penis occurring with erection. The curve may cause pain or the penis may be too curved to allow for intercourse.  Never having nighttime erections. How is this diagnosed? This condition is often diagnosed by:  Performing a physical exam to find other  diseases or specific problems with the penis.  Asking you detailed questions about the problem.  Performing blood tests to check for diabetes mellitus or to measure hormone levels.  Performing other tests to check for underlying health conditions.  Performing an ultrasound exam to check for scarring.  Performing a test to check blood flow to the penis.  Doing a sleep study at home to measure nighttime erections. How is this treated? This condition may be treated by:  Medicine taken by mouth to help you achieve an erection (oral medicine).  Hormone replacement therapy to replace low testosterone levels.  Medicine that is injected into the penis. Your health care provider may instruct you how to give yourself these injections at home.  Vacuum pump. This is a pump with a ring on it. The pump and ring are placed on the penis and used to create pressure that helps the penis become erect.  Penile implant surgery. In this procedure, you may receive: ? An inflatable implant. This consists of cylinders, a pump, and a reservoir. The cylinders can be inflated with a fluid that helps to create an erection, and they can be deflated after intercourse. ? A semi-rigid implant. This consists of two silicone rubber rods. The rods provide some rigidity. They are also flexible, so the penis can both curve downward in its normal position and become straight for sexual intercourse.  Blood vessel surgery, to improve blood flow to the penis. During this procedure, a blood vessel from a different part of the body is placed into the penis to allow blood to flow around (bypass) damaged or blocked blood vessels.  Lifestyle changes, such as exercising more,  losing weight, and quitting smoking. Follow these instructions at home: Medicines   Take over-the-counter and prescription medicines only as told by your health care provider. Do not increase the dosage without first discussing it with your health care  provider.  If you are using self-injections, perform injections as directed by your health care provider. Make sure to avoid any veins that are on the surface of the penis. After giving an injection, apply pressure to the injection site for 5 minutes. General instructions  Exercise regularly, as directed by your health care provider. Work with your health care provider to lose weight, if needed.  Do not use any products that contain nicotine or tobacco, such as cigarettes and e-cigarettes. If you need help quitting, ask your health care provider.  Before using a vacuum pump, read the instructions that come with the pump and discuss any questions with your health care provider.  Keep all follow-up visits as told by your health care provider. This is important. Contact a health care provider if:  You feel nauseous.  You vomit. Get help right away if:  You are taking oral or injectable medicines and you have an erection that lasts longer than 4 hours. If your health care provider is unavailable, go to the nearest emergency room for evaluation. An erection that lasts much longer than 4 hours can result in permanent damage to your penis.  You have severe pain in your groin or abdomen.  You develop redness or severe swelling of your penis.  You have redness spreading up into your groin or lower abdomen.  You are unable to urinate.  You experience chest pain or a rapid heart beat (palpitations) after taking oral medicines. Summary  Erectile dysfunction (ED) is the inability to get or keep an erection during sexual intercourse. This problem can usually be treated successfully.  This condition is diagnosed based on a physical exam, your symptoms, and tests to determine the cause. Treatment varies depending on the cause, and may include medicines, hormone therapy, surgery, or vacuum pump.  You may need follow-up visits to make sure that you are using your medicines or devices correctly.   Get help right away if you are taking or injecting medicines and you have an erection that lasts longer than 4 hours. This information is not intended to replace advice given to you by your health care provider. Make sure you discuss any questions you have with your health care provider. Document Released: 04/28/2000 Document Revised: 05/17/2016 Document Reviewed: 05/17/2016 Elsevier Interactive Patient Education  2019 [employer].

## 2018-11-21 NOTE — Progress Notes (Signed)
NEUROLOGY FOLLOW UP OFFICE NOTE  Jonathan Mills [id number]  HISTORY OF PRESENT ILLNESS: Jonathan Mills is a 58 year-old male who follows up for migraine and tension type headache.  UPDATE: Stopped Nasocort in February-March.  Only 1 time a month until June.  Increased frequency.  Cannot pinpoint a trigger.  Started dating a new woman at that time.  Spends more time outside (golf and hiking).  Works from home. May be weather (more storms over past month). Intensity:  2-9/10 Duration:  Cut sumatriptan to 50mg  due to increased nausea on 100mg .  Does not treat every migraine.  2 hours Frequency:  9 days in June.  4 days last week. Current NSAIDS:  none Current analgesics:  none Current triptans: Sumatriptan 100 mg Current ergotamine: None Current anti-emetic: None Current muscle relaxants: None Current anti-anxiolytic: None Current sleep aide: None Current Antihypertensive medications: None Current Antidepressant medications: None Current Anticonvulsant medications: None Current anti-CGRP: None Current Vitamins/Herbal/Supplements: Magnesium citrate 400 mg Current Antihistamines/Decongestants: None Other therapy: None  Caffeine: 2 cups of coffee daily Alcohol: Occasional Smoker: No Diet:  hydrates Exercise: No Depression: Improved; Anxiety: Proved Other pain: No Sleep hygiene: Able  HISTORY: Onset: History of tension type headaches since 20s.  Migraine since 2016. Location: Unilateral Quality: sharp Initial Intensity: 9-10/10 Aura: No Prodrome: No Postdrome: No Associated symptoms:  Nausea, photophobia, phonophobia, sometimes vomiting, tinnitus, nasal congestion/runny nose.  No visual disturbance, ptosis or conjunctival injection. She has not had any new worse headache of her life, waking up from sleep Initial Duration:1 hour intermittently for2-3 days; Initial Frequency: 1 to 2 times a month Initial Frequency of abortive medication: 1-2 days a week (Tylenol for  body aches) Triggers:  Certain smells (Tide laundry detergent); Possibly alcohol at times. Relieving factors:  None Activity: Aggravates  He also has history of chronic tension-type headaches that have been resolved for several years.  Past NSAIDS: Ibuprofen, naproxen Past analgesics:Tylenol Past abortive triptans: no Past muscle relaxants: no Past anti-emetic: no Past antihypertensive medications: no Past antidepressant medications: Lexapro, nortriptyline 10mg  (constipation) Past anticonvulsant medications:lamotrigine, topiramate ER (trouble concentrating) Past vitamins/Herbal/Supplements: no Other past therapies: no  Family history of headache: Mom had migraines Other family history: Father: brain cancer (possibly GBM), maternal grandfather: Alzheimer's dementia.  Of note, he has endorsed memory deficits in the past. He underwent neuropsychological testing on 02/12/17, which demonstrated depression but no cognitive impairment.  PAST MEDICAL HISTORY: Past Medical History:  Diagnosis Date  . ALLERGIC RHINITIS 10/07/2008  . COLONIC POLYPS, HX OF 10/07/2008  . GERD 10/07/2008  . Headache(784.0)    sinus  . HYPERLIPIDEMIA 10/07/2008  . Hypertriglyceridemia 07/12/2011  . Mood swings   . Vitamin D deficiency 07/12/2011    MEDICATIONS: Current Outpatient Medications on File Prior to Visit  Medication Sig Dispense Refill  . Apoaequorin (PREVAGEN EXTRA STRENGTH PO) Take 1 tablet by mouth daily.    . fenofibrate 160 MG tablet Take 1 tablet (160 mg total) by mouth daily. 90 tablet 3  . magnesium 30 MG tablet Take 30 mg by mouth 2 (two) times daily.    Marland Kitchen omeprazole (PRILOSEC) 20 MG capsule Take 1 capsule (20 mg total) by mouth daily. 90 capsule 3  . SUMAtriptan (IMITREX) 25 MG tablet Take 1 tablet earliest onset of migraine.  May repeat in 2 hours if headache persists or recurs.  Maximum 2 tablets in 24 hours 10 tablet 3  . vardenafil (LEVITRA) 10 MG tablet Take 1  tablet (10 mg total) by  mouth daily as needed for erectile dysfunction. 10 tablet 5   No current facility-administered medications on file prior to visit.     ALLERGIES: Allergies  Allergen Reactions  . Penicillins     REACTION: rash    FAMILY HISTORY: Family History  Problem Relation Age of Onset  . Cancer Father    SOCIAL HISTORY: Social History   Socioeconomic History  . Marital status: Single    Spouse name: Not on file  . Number of children: Not on file  . Years of education: BS  . Highest education level: Not on file  Occupational History  . Occupation: IT  Social Needs  . Financial resource strain: Not on file  . Food insecurity    Worry: Not on file    Inability: Not on file  . Transportation needs    Medical: Not on file    Non-medical: Not on file  Tobacco Use  . Smoking status: Never Smoker  . Smokeless tobacco: Never Used  Substance and Sexual Activity  . Alcohol use: Yes    Alcohol/week: 1.0 standard drinks    Types: 1 Cans of beer per week  . Drug use: No  . Sexual activity: Not on file  Lifestyle  . Physical activity    Days per week: Not on file    Minutes per session: Not on file  . Stress: Not on file  Relationships  . Social Herbalist on phone: Not on file    Gets together: Not on file    Attends religious service: Not on file    Active member of club or organization: Not on file    Attends meetings of clubs or organizations: Not on file    Relationship status: Not on file  . Intimate partner violence    Fear of current or ex partner: Not on file    Emotionally abused: Not on file    Physically abused: Not on file    Forced sexual activity: Not on file  Other Topics Concern  . 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    REVIEW OF SYSTEMS: Constitutional: No fevers, chills, or sweats, no generalized fatigue, change in appetite Eyes: No visual changes, double  vision, eye pain Ear, nose and throat: No hearing loss, ear pain, nasal congestion, sore throat Cardiovascular: No chest pain, palpitations Respiratory:  No shortness of breath at rest or with exertion, wheezes GastrointestinaI: No nausea, vomiting, diarrhea, abdominal pain, fecal incontinence Genitourinary:  No dysuria, urinary retention or frequency Musculoskeletal:  No neck pain, back pain Integumentary: No rash, pruritus, skin lesions Neurological: as above Psychiatric: No depression, insomnia, anxiety Endocrine: No palpitations, fatigue, diaphoresis, mood swings, change in appetite, change in weight, increased thirst Hematologic/Lymphatic:  No purpura, petechiae. Allergic/Immunologic: no itchy/runny eyes, nasal congestion, recent allergic reactions, rashes  PHYSICAL EXAM: Blood pressure 119/77, pulse 63, temperature 98.1 F (36.7 C), temperature source Oral, height 6' (1.829 m), weight 190 lb (86.2 kg), SpO2 97 %.  General: No acute distress.  Patient appears well-groomed.   Head:  Normocephalic/atraumatic Eyes:  Fundi examined but not visualized Neck: supple, no paraspinal tenderness, full range of motion Heart:  Regular rate and rhythm Lungs:  Clear to auscultation bilaterally Back: No paraspinal tenderness Neurological Exam: alert and oriented to person, place, and time. Attention span and concentration intact, recent and remote memory intact, fund of knowledge intact.  Speech fluent  and not dysarthric, language intact.  CN II-XII intact. Bulk and tone normal, muscle strength 5/5 throughout.  Sensation to light touch  intact.  Deep tendon reflexes 2+ throughout.  Finger to nose testing intact.  Gait normal, Romberg negative.  IMPRESSION: Migraine without aura, without status migrainosus, not intractable.  Increased frequency over past month.  Possibly related to weather (increased storms).  He has a new girlfriend.  Consider her perfume?  He will monitor.  PLAN: 1.  He defers  preventative management for now.  If headaches frequent over next month, will start an anti-CGRP 2. For abortive therapy, sumatriptan  3.  Limit use of pain relievers to no more than 2 days out of week to prevent risk of rebound or medication-overuse headache. 4.  Keep headache diary 5.  Exercise, hydration, caffeine cessation, sleep hygiene, monitor for and avoid triggers 6.  Consider:  magnesium citrate 400mg  daily, riboflavin 400mg  daily, and coenzyme Q10 100mg  three times daily 7. Always keep in mind that currently taking a hormone or birth control may be a possible trigger or aggravating factor for migraine. 8. Follow up 4 months  Metta Clines, DO

## 2018-11-22 ENCOUNTER — Encounter: Payer: Self-pay | Admitting: Neurology

## 2018-11-22 ENCOUNTER — Ambulatory Visit (INDEPENDENT_AMBULATORY_CARE_PROVIDER_SITE_OTHER): Payer: 59 | Admitting: Neurology

## 2018-11-22 ENCOUNTER — Other Ambulatory Visit: Payer: Self-pay

## 2018-11-22 VITALS — BP 119/77 | HR 63 | Temp 98.1°F | Ht 72.0 in | Wt 190.0 lb

## 2018-11-22 DIAGNOSIS — G43009 Migraine without aura, not intractable, without status migrainosus: Secondary | ICD-10-CM

## 2018-11-22 MED ORDER — SUMATRIPTAN SUCCINATE 100 MG PO TABS: ORAL_TABLET | ORAL | 3 refills | Status: DC

## 2018-11-22 NOTE — Patient Instructions (Signed)
1.  Take sumatriptan 50mg  at earliest onset of migraine.  May repeat after 2 hours if needed, maximum 200mg  in 24 hours. 2.  Limit use of pain relievers to no more than 2 days out of week to prevent risk of rebound or medication-overuse headache. 3.  Keep headache diary 4.  Exercise, hydration, caffeine cessation, sleep hygiene, monitor for and avoid triggers 5.  Consider:  magnesium citrate 400mg  daily, riboflavin 400mg  daily, and coenzyme Q10 100mg  three times daily 6. Always keep in mind that currently taking a hormone or birth control may be a possible trigger or aggravating factor for migraine. 7. Follow up 4 months.

## 2018-11-26 ENCOUNTER — Ambulatory Visit: Payer: 59 | Admitting: Physician Assistant

## 2018-12-23 ENCOUNTER — Encounter: Payer: 59 | Admitting: Family Medicine

## 2019-01-03 ENCOUNTER — Encounter: Payer: 59 | Admitting: Family Medicine

## 2019-01-22 ENCOUNTER — Other Ambulatory Visit: Payer: Self-pay | Admitting: Internal Medicine

## 2019-01-29 ENCOUNTER — Encounter: Payer: 59 | Admitting: Family Medicine

## 2019-02-05 ENCOUNTER — Ambulatory Visit (INDEPENDENT_AMBULATORY_CARE_PROVIDER_SITE_OTHER): Payer: 59 | Admitting: Family Medicine

## 2019-02-05 ENCOUNTER — Encounter: Payer: Self-pay | Admitting: Family Medicine

## 2019-02-05 ENCOUNTER — Other Ambulatory Visit: Payer: Self-pay

## 2019-02-05 VITALS — BP 117/80 | HR 80 | Temp 98.3°F | Ht 72.0 in | Wt 186.6 lb

## 2019-02-05 DIAGNOSIS — Z125 Encounter for screening for malignant neoplasm of prostate: Secondary | ICD-10-CM | POA: Diagnosis not present

## 2019-02-05 DIAGNOSIS — D239 Other benign neoplasm of skin, unspecified: Secondary | ICD-10-CM | POA: Diagnosis not present

## 2019-02-05 DIAGNOSIS — J309 Allergic rhinitis, unspecified: Secondary | ICD-10-CM

## 2019-02-05 DIAGNOSIS — R002 Palpitations: Secondary | ICD-10-CM

## 2019-02-05 DIAGNOSIS — E559 Vitamin D deficiency, unspecified: Secondary | ICD-10-CM | POA: Diagnosis not present

## 2019-02-05 DIAGNOSIS — Z0000 Encounter for general adult medical examination without abnormal findings: Secondary | ICD-10-CM

## 2019-02-05 DIAGNOSIS — E782 Mixed hyperlipidemia: Secondary | ICD-10-CM | POA: Diagnosis not present

## 2019-02-05 DIAGNOSIS — N529 Male erectile dysfunction, unspecified: Secondary | ICD-10-CM

## 2019-02-05 DIAGNOSIS — G43909 Migraine, unspecified, not intractable, without status migrainosus: Secondary | ICD-10-CM | POA: Insufficient documentation

## 2019-02-05 MED ORDER — AZELASTINE-FLUTICASONE 137-50 MCG/ACT NA SUSP: 1.0000 | Freq: Every day | NASAL | 3 refills | Status: DC

## 2019-02-05 NOTE — Progress Notes (Signed)
Patient: Jonathan Mills MRN: [id number] DOB: 09-10-1960 PCP: Orma Flaming, MD     Subjective:  Chief Complaint  Patient presents with  . Transitions Of Care  . Annual Exam    HPI: The patient is a 58 y.o. male who presents today for annual exam. He denies any changes to past medical history. There have been no recent hospitalizations. They are following a well balanced diet and exercise plan. Weight has been stable. Multiple complaints.   No family hx of colon or breast cancer.   Hx of migraines: followed by neurology. Doing well. imitrex prn and magnesium. Found out alcohol was cause of his increased migraines and cut this out. Really improved headaches.   GERd: well controlled on prilosec.   Hyperlipidemia/high TG: on fenofibrate. Tolerates well. No FH of heart disease in family. No smoking. Due for labs today. Not fasting so will come back.   ED: recently started on levitra and works well. No side effects and very happy with this drug.   Allergic rhinitis and nasal polyps: has tried nasacort and failed this. Never hx of polyp, sinus surgery.   Heart pounding: has about a 3-5 year history of his heart pounding hard intermittently. Usually happens about once/month. Never fast, irregular just "hard beating." lasts about less than 10 minutes. Not short of breath, no chest pain, no sweating. No other symptoms. He denies being anxious. Usually happens at rest while sitting down. No exacerbation with exercise. No family hx of heart disease. He does not smoke and no history of diabetes.   Skin change on his scrotum: he has little red "blood spots" on his scrotum that he noticed about 5 years ago. He feels like these are getting worse meaning growing in number. He had 1-2 and now he has like 8. No change in size, no pain, no bleeding. No hx of skin cancer.   Fatigue: he is sleepy about 2 days a week. Has been seen in the past for fatigue and had MRI of head done. caffeine and short naps do  help. Does not have narcolepsy. He does sleep good most of the time. Half of the nights he wakes up in the middle of the night and has trouble falling back asleep. He does have some ways to combat this.   Immunization History  Administered Date(s) Administered  . Influenza Whole 03/09/2009  . Tdap 10/28/2014   Colonoscopy: may be due for this. Need records.  PSA: today  Flu: October   Review of Systems  Constitutional: Negative for chills, fatigue and fever.  HENT: Negative for congestion, dental problem, ear pain, hearing loss, postnasal drip, rhinorrhea, sore throat and trouble swallowing.   Eyes: Negative for visual disturbance.  Respiratory: Negative for cough, chest tightness and shortness of breath.   Cardiovascular: Negative for chest pain, palpitations and leg swelling.  Gastrointestinal: Negative for abdominal pain, blood in stool, diarrhea, nausea and vomiting.  Endocrine: Negative for cold intolerance, polydipsia, polyphagia and polyuria.  Genitourinary: Negative for dysuria and hematuria.  Musculoskeletal: Positive for back pain. Negative for arthralgias, myalgias and neck pain.  Skin: Negative for rash.  Neurological: Positive for headaches. Negative for dizziness.  Psychiatric/Behavioral: Negative for dysphoric mood and sleep disturbance. The patient is not nervous/anxious.     Allergies Patient is allergic to penicillins.  Past Medical History Patient  has a past medical history of ALLERGIC RHINITIS (10/07/2008), COLONIC POLYPS, HX OF (10/07/2008), GERD (10/07/2008), Headache(784.0), HYPERLIPIDEMIA (10/07/2008), Hypertriglyceridemia (07/12/2011), Mood swings, and Vitamin D deficiency (  07/12/2011).  Surgical History Patient  has a past surgical history that includes right ear/neck surgury (1997); Eye surgery; and Shoulder arthroscopy with subacromial decompression, rotator cuff repair and bicep tendon repair (Left, 12/18/2013).  Family History Pateint's family history  includes Cancer in his father.  Social History Patient  reports that he has never smoked. He has never used smokeless tobacco. He reports current alcohol use of about 1.0 standard drinks of alcohol per week. He reports that he does not use drugs.    Objective: Vitals:   02/05/19 0945  BP: 117/80  Pulse: 80  Temp: 98.3 F (36.8 C)  TempSrc: Skin  SpO2: 96%  Weight: 186 lb 9.6 oz (84.6 kg)  Height: 6' (1.829 m)    Body mass index is 25.31 kg/m.  Physical Exam Vitals signs reviewed.  Constitutional:      Appearance: Normal appearance. He is well-developed and normal weight.  HENT:     Head: Normocephalic and atraumatic.     Right Ear: Tympanic membrane, ear canal and external ear normal.     Left Ear: Tympanic membrane, ear canal and external ear normal.     Nose: Nose normal.     Mouth/Throat:     Mouth: Mucous membranes are moist.  Eyes:     Extraocular Movements: Extraocular movements intact.     Conjunctiva/sclera: Conjunctivae normal.     Pupils: Pupils are equal, round, and reactive to light.  Neck:     Musculoskeletal: Normal range of motion and neck supple.     Thyroid: No thyromegaly.     Comments: No thyroid abnormality appreciated.  Cardiovascular:     Rate and Rhythm: Normal rate and regular rhythm.     Pulses: Normal pulses.     Heart sounds: Normal heart sounds. No murmur.  Pulmonary:     Effort: Pulmonary effort is normal.     Breath sounds: Normal breath sounds.  Abdominal:     General: Abdomen is flat. Bowel sounds are normal. There is no distension.     Palpations: Abdomen is soft.     Tenderness: There is no abdominal tenderness.  Lymphadenopathy:     Cervical: No cervical adenopathy.  Skin:    General: Skin is warm and dry.     Findings: No rash.     Comments: <57mm purple/red papules on bilateral scrotum.   Neurological:     Mental Status: He is alert and oriented to person, place, and time.     Cranial Nerves: No cranial nerve deficit.      Coordination: Coordination normal.     Deep Tendon Reflexes: Reflexes normal.  Psychiatric:        Behavior: Behavior normal.        Depression screen [facility] 2/9 02/05/2019 10/23/2017 10/28/2015 10/28/2014 10/17/2013  Decreased Interest 0 0 0 0 0  Down, Depressed, Hopeless 0 1 0 0 0  PHQ - 2 Score 0 1 0 0 0    Assessment/plan: 1. Annual physical exam Will come back for fasting labs. Wants to get flu shot in October and requested he send [place] record. I think he is due for colonoscopy, but he thinks he has had one since 2010. Requested he get this for me. utd on HM pending csope and flu vaccine. Really encouraged he start a regular exercise program. F/u in one year or as needed.  Patient counseling [x]    Nutrition: Stressed importance of moderation in sodium/caffeine intake, saturated fat and cholesterol, caloric balance, sufficient intake of fresh  fruits, vegetables, fiber, calcium, iron, and 1 mg of folate supplement per day (for females capable of pregnancy).  [x]    Stressed the importance of regular exercise.   []    Substance Abuse: Discussed cessation/primary prevention of tobacco, alcohol, or other drug use; driving or other dangerous activities under the influence; availability of treatment for abuse.   [x]    Injury prevention: Discussed safety belts, safety helmets, smoke detector, smoking near bedding or upholstery.   [x]    Sexuality: Discussed sexually transmitted diseases, partner selection, use of condoms, avoidance of unintended pregnancy  and contraceptive alternatives.  [x]    Dental health: Discussed importance of regular tooth brushing, flossing, and dental visits.  [x]    Health maintenance and immunizations reviewed. Please refer to Health maintenance section.    - CBC with Differential/Platelet; Future - Comprehensive metabolic panel; Future - TSH; Future  2. Mixed hyperlipidemia  - Comprehensive metabolic panel; Future - Lipid panel; Future  3. Vitamin D deficiency  -  VITAMIN D 25 Hydroxy (Vit-D Deficiency, Fractures); Future  4. Prostate cancer screening  - PSA; Future  5. 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 Assurance given.   6. Pounding heartbeat Does not sound pathological. He is going to keep a journal to see if he can correlate this with anything. Doesn't happen often so may not be easiest to figure out. If continues or increases in frequency I want to do zio and echo. He is on board with this. precautions given and f/u in 3 months.   7. Vasculogenic erectile dysfunction, unspecified vasculogenic erectile dysfunction type Doing very well with medication and very happy. No side effects. continue prn.   8. Allergic rhinitis/polyps -trial of dymista. Failed nasacort.     Return in about 3 months (around 05/07/2019) for heart symptom follow up .   Orma Flaming, MD [place]  02/05/2019;

## 2019-02-05 NOTE — Patient Instructions (Signed)
Cherry Angioma A cherry angioma is a harmless growth on the skin. It is made up of blood vessels. Cherry angiomas can appear anywhere on the body, but they usually appear on the trunk and arms. What are the causes? The cause of this condition is not known, but it seems to be related to advancing age. What increases the risk? You are more likely to develop this condition if you:  Are over the age of 21.  Have a family member with this condition. What are the signs or symptoms?   Symptoms of this condition include harmless growths that are: ? Smooth, round, and red or purplish-red. ? As small as the tip of a pin or as big as a pencil eraser. How is this diagnosed? This condition is diagnosed with a skin exam. Rarely, a piece of the cherry angioma may be removed for testing if it is not clear that the growth is a cherry angioma. How is this treated? Treatment is not needed for this condition. If you do not like the way a cherry angioma looks, you may have it removed. Removal methods include:  A method where heat is used to burn the cherry angioma off the skin (electrocautery).  A method where the cherry angioma is frozen (cryosurgery). This causes it to eventually fall off the skin.  A method where a laser is used to destroy the red blood cells and blood vessels in the angioma (laser therapy).  A minor surgical procedure. A scalpel is used to remove the cherry angioma off the skin. A cherry angioma may come back after it has been removed. Follow these instructions at home:  If you have a cherry angioma removed, keep the area clean and follow any other care instructions as told by your health care provider.  Take over-the-counter and prescription medicines only as told by your health care provider.  Keep all follow-up visits as told by your health care provider. This is important. Summary  A cherry angioma is a harmless growth on the skin that is made up of blood vessels.   Treatment is not needed for this condition.  If you do not like the way a cherry angioma looks, you may have it removed.  If you have a cherry angioma removed, follow any care instructions as told by your health care provider. This information is not intended to replace advice given to you by your health care provider. Make sure you discuss any questions you have with your health care provider. Document Released: 07/10/2001 Document Revised: 11/20/2017 Document Reviewed: 11/20/2017 Elsevier Patient Education  2020 [employer]. Preventive Care 76-32 Years Old, Male Preventive care refers to lifestyle choices and visits with your health care provider that can promote health and wellness. This includes:  A yearly physical exam. This is also called an annual well check.  Regular dental and eye exams.  Immunizations.  Screening for certain conditions.  Healthy lifestyle choices, such as eating a healthy diet, getting regular exercise, not using drugs or products that contain nicotine and tobacco, and limiting alcohol use. What can I expect for my preventive care visit? Physical exam Your health care provider will check:  Height and weight. These may be used to calculate body mass index (BMI), which is a measurement that tells if you are at a healthy weight.  Heart rate and blood pressure.  Your skin for abnormal spots. Counseling Your health care provider may ask you questions about:  Alcohol, tobacco, and drug use.  Emotional well-being.  Home and relationship well-being.  Sexual activity.  Eating habits.  Work and work Statistician. What immunizations do I need?  Influenza (flu) vaccine  This is recommended every year. Tetanus, diphtheria, and pertussis (Tdap) vaccine  You may need a Td booster every 10 years. Varicella (chickenpox) vaccine  You may need this vaccine if you have not already been vaccinated. Zoster (shingles) vaccine  You may need this after age  18. Measles, mumps, and rubella (MMR) vaccine  You may need at least one dose of MMR if you were born in 1957 or later. You may also need a second dose. Pneumococcal conjugate (PCV13) vaccine  You may need this if you have certain conditions and were not previously vaccinated. Pneumococcal polysaccharide (PPSV23) vaccine  You may need one or two doses if you smoke cigarettes or if you have certain conditions. Meningococcal conjugate (MenACWY) vaccine  You may need this if you have certain conditions. Hepatitis A vaccine  You may need this if you have certain conditions or if you travel or work in places where you may be exposed to hepatitis A. Hepatitis B vaccine  You may need this if you have certain conditions or if you travel or work in places where you may be exposed to hepatitis B. Haemophilus influenzae type b (Hib) vaccine  You may need this if you have certain risk factors. Human papillomavirus (HPV) vaccine  If recommended by your health care provider, you may need three doses over 6 months. You may receive vaccines as individual doses or as more than one vaccine together in one shot (combination vaccines). Talk with your health care provider about the risks and benefits of combination vaccines. What tests do I need? Blood tests  Lipid and cholesterol levels. These may be checked every 5 years, or more frequently if you are over 56 years old.  Hepatitis C test.  Hepatitis B test. Screening  Lung cancer screening. You may have this screening every year starting at age 34 if you have a 30-pack-year history of smoking and currently smoke or have quit within the past 15 years.  Prostate cancer screening. Recommendations will vary depending on your family history and other risks.  Colorectal cancer screening. All adults should have this screening starting at age 7 and continuing until age 63. Your health care provider may recommend screening at age 79 if you are at  increased risk. You will have tests every 1-10 years, depending on your results and the type of screening test.  Diabetes screening. This is done by checking your blood sugar (glucose) after you have not eaten for a while (fasting). You may have this done every 1-3 years.  Sexually transmitted disease (STD) testing. Follow these instructions at home: Eating and drinking  Eat a diet that includes fresh fruits and vegetables, whole grains, lean protein, and low-fat dairy products.  Take vitamin and mineral supplements as recommended by your health care provider.  Do not drink alcohol if your health care provider tells you not to drink.  If you drink alcohol: ? Limit how much you have to 0-2 drinks a day. ? Be aware of how much alcohol is in your drink. In the [place] one drink equals one 12 oz bottle of beer (355 mL), one 5 oz glass of wine (148 mL), or one 1 oz glass of hard liquor (44 mL). Lifestyle  Take daily care of your teeth and gums.  Stay active. Exercise for at least 30 minutes on 5 or more days  each week.  Do not use any products that contain nicotine or tobacco, such as cigarettes, e-cigarettes, and chewing tobacco. If you need help quitting, ask your health care provider.  If you are sexually active, practice safe sex. Use a condom or other form of protection to prevent STIs (sexually transmitted infections).  Talk with your health care provider about taking a low-dose aspirin every day starting at age 42. What's next?  Go to your health care provider once a year for a well check visit.  Ask your health care provider how often you should have your eyes and teeth checked.  Stay up to date on all vaccines. This information is not intended to replace advice given to you by your health care provider. Make sure you discuss any questions you have with your health care provider. Document Released: 05/28/2015 Document Revised: 04/25/2018 Document Reviewed: 04/25/2018 Elsevier  Patient Education  2020 [employer].

## 2019-02-06 ENCOUNTER — Telehealth: Payer: Self-pay

## 2019-02-06 NOTE — Telephone Encounter (Signed)
Prior Authorization started for Dymista.  Documentation faxed to Cover My Meds.

## 2019-02-13 ENCOUNTER — Other Ambulatory Visit: Payer: 59

## 2019-02-14 ENCOUNTER — Other Ambulatory Visit (INDEPENDENT_AMBULATORY_CARE_PROVIDER_SITE_OTHER): Payer: 59

## 2019-02-14 ENCOUNTER — Other Ambulatory Visit: Payer: Self-pay

## 2019-02-14 DIAGNOSIS — E559 Vitamin D deficiency, unspecified: Secondary | ICD-10-CM

## 2019-02-14 DIAGNOSIS — Z0000 Encounter for general adult medical examination without abnormal findings: Secondary | ICD-10-CM

## 2019-02-14 DIAGNOSIS — E782 Mixed hyperlipidemia: Secondary | ICD-10-CM | POA: Diagnosis not present

## 2019-02-14 DIAGNOSIS — Z125 Encounter for screening for malignant neoplasm of prostate: Secondary | ICD-10-CM

## 2019-02-14 LAB — VITAMIN D 25 HYDROXY (VIT D DEFICIENCY, FRACTURES): VITD: 63.98 ng/mL (ref 30.00–100.00)

## 2019-02-14 LAB — CBC WITH DIFFERENTIAL/PLATELET
Basophils Absolute: 0 10*3/uL (ref 0.0–0.1)
Basophils Relative: 0.4 % (ref 0.0–3.0)
Eosinophils Absolute: 0.1 10*3/uL (ref 0.0–0.7)
Eosinophils Relative: 2.5 % (ref 0.0–5.0)
HCT: 40.2 % (ref 39.0–52.0)
Hemoglobin: 13.4 g/dL (ref 13.0–17.0)
Lymphocytes Relative: 41.4 % (ref 12.0–46.0)
Lymphs Abs: 1.6 10*3/uL (ref 0.7–4.0)
MCHC: 33.3 g/dL (ref 30.0–36.0)
MCV: 88.1 fl (ref 78.0–100.0)
Monocytes Absolute: 0.3 10*3/uL (ref 0.1–1.0)
Monocytes Relative: 7.6 % (ref 3.0–12.0)
Neutro Abs: 1.9 10*3/uL (ref 1.4–7.7)
Neutrophils Relative %: 48.1 % (ref 43.0–77.0)
Platelets: 148 10*3/uL — ABNORMAL LOW (ref 150.0–400.0)
RBC: 4.57 Mil/uL (ref 4.22–5.81)
RDW: 12.9 % (ref 11.5–15.5)
WBC: 3.9 10*3/uL — ABNORMAL LOW (ref 4.0–10.5)

## 2019-02-14 LAB — LIPID PANEL
Cholesterol: 145 mg/dL (ref 0–200)
HDL: 55.4 mg/dL (ref >39.00)
LDL Cholesterol: 78 mg/dL (ref 0–99)
NonHDL: 89.45
Total CHOL/HDL Ratio: 3
Triglycerides: 57 mg/dL (ref 0.0–149.0)
VLDL: 11.4 mg/dL (ref 0.0–40.0)

## 2019-02-14 LAB — COMPREHENSIVE METABOLIC PANEL
ALT: 19 U/L (ref 0–53)
AST: 21 U/L (ref 0–37)
Albumin: 4.5 g/dL (ref 3.5–5.2)
Alkaline Phosphatase: 46 U/L (ref 39–117)
BUN: 16 mg/dL (ref 6–23)
CO2: 26 mEq/L (ref 19–32)
Calcium: 9.4 mg/dL (ref 8.4–10.5)
Chloride: 108 mEq/L (ref 96–112)
Creatinine, Ser: 1.18 mg/dL (ref 0.40–1.50)
GFR: 63.6 mL/min (ref >60.00)
Glucose, Bld: 93 mg/dL (ref 70–99)
Potassium: 4 mEq/L (ref 3.5–5.1)
Sodium: 143 mEq/L (ref 135–145)
Total Bilirubin: 0.5 mg/dL (ref 0.2–1.2)
Total Protein: 6.9 g/dL (ref 6.0–8.3)

## 2019-02-14 LAB — PSA: PSA: 0.43 ng/mL (ref 0.10–4.00)

## 2019-02-14 LAB — TSH: TSH: 1.02 u[IU]/mL (ref 0.35–4.50)

## 2019-03-27 ENCOUNTER — Ambulatory Visit: Payer: 59 | Admitting: Neurology

## 2020-01-07 ENCOUNTER — Ambulatory Visit (INDEPENDENT_AMBULATORY_CARE_PROVIDER_SITE_OTHER): Payer: 59 | Admitting: Family Medicine

## 2020-01-07 ENCOUNTER — Encounter: Payer: Self-pay | Admitting: Family Medicine

## 2020-01-07 ENCOUNTER — Other Ambulatory Visit: Payer: Self-pay

## 2020-01-07 ENCOUNTER — Other Ambulatory Visit: Payer: Self-pay | Admitting: Internal Medicine

## 2020-01-07 VITALS — BP 110/68 | HR 53 | Temp 97.7°F | Ht 72.0 in | Wt 97.7 lb

## 2020-01-07 DIAGNOSIS — E559 Vitamin D deficiency, unspecified: Secondary | ICD-10-CM | POA: Diagnosis not present

## 2020-01-07 DIAGNOSIS — Z0000 Encounter for general adult medical examination without abnormal findings: Secondary | ICD-10-CM

## 2020-01-07 DIAGNOSIS — Z125 Encounter for screening for malignant neoplasm of prostate: Secondary | ICD-10-CM

## 2020-01-07 DIAGNOSIS — E782 Mixed hyperlipidemia: Secondary | ICD-10-CM | POA: Diagnosis not present

## 2020-01-07 NOTE — Progress Notes (Signed)
Patient: Jonathan Mills MRN: [id number] DOB: 06-27-1960 PCP: Orma Flaming, MD     Subjective:  Chief Complaint  Patient presents with  . Annual Exam    HPI: The patient is a 59 y.o. male who presents today for annual exam. He denies any changes to past medical history. There have been no recent hospitalizations. They are following a well balanced diet and exercise plan. Weight has been stable. No complaints today.   Hyperlipidemia Was on fenofibrate. He stopped this 7-8 weeks ago and has started to work out. Doing [employer] twice a week. He also goes walking as often as he can. No fH of heart disease. NO smoking.   Migraines Followed by neurology. On imitrex prn and states his headaches are very well controlled.   Immunization History  Administered Date(s) Administered  . Influenza Whole 03/09/2009  . Tdap 10/28/2014   Colonoscopy: 03/15/2014.  PSA: today  covid vaccine: did back in April. Had J&J.   Review of Systems  Constitutional: Negative for chills, fatigue and fever.  HENT: Negative for dental problem, ear pain, hearing loss and trouble swallowing.   Eyes: Negative for visual disturbance.  Respiratory: Negative for cough, chest tightness and shortness of breath.   Cardiovascular: Negative for chest pain, palpitations and leg swelling.  Gastrointestinal: Negative for abdominal pain, blood in stool, diarrhea and nausea.  Endocrine: Negative for cold intolerance, polydipsia, polyphagia and polyuria.  Genitourinary: Negative for dysuria, frequency, hematuria, testicular pain and urgency.  Musculoskeletal: Negative for arthralgias.  Skin: Negative for rash.  Neurological: Negative for dizziness and headaches.  Psychiatric/Behavioral: Negative for dysphoric mood and sleep disturbance. The patient is not nervous/anxious.     Allergies Patient is allergic to penicillins.  Past Medical History Patient  has a past medical history of ALLERGIC RHINITIS (10/07/2008),  COLONIC POLYPS, HX OF (10/07/2008), GERD (10/07/2008), Headache(784.0), HYPERLIPIDEMIA (10/07/2008), Hypertriglyceridemia (07/12/2011), Mood swings, and Vitamin D deficiency (07/12/2011).  Surgical History Patient  has a past surgical history that includes right ear/neck surgury (1997); Eye surgery; and Shoulder arthroscopy with subacromial decompression, rotator cuff repair and bicep tendon repair (Left, 12/18/2013).  Family History Pateint's family history includes Cancer in his father.  Social History Patient  reports that he has never smoked. He has never used smokeless tobacco. He reports current alcohol use of about 1.0 standard drink of alcohol per week. He reports that he does not use drugs.    Objective: Vitals:   01/07/20 1114  BP: 110/68  Pulse: (!) 53  Temp: 97.7 F (36.5 C)  TempSrc: Temporal  SpO2: 98%  Weight: 97 lb 11.2 oz (44.3 kg)  Height: 6' (1.829 m)    Body mass index is 13.25 kg/m.  Physical Exam Vitals reviewed.  Constitutional:      Appearance: Normal appearance. He is well-developed and normal weight.  HENT:     Head: Normocephalic and atraumatic.     Right Ear: Tympanic membrane, ear canal and external ear normal.     Left Ear: Tympanic membrane, ear canal and external ear normal.     Nose: Nose normal.     Mouth/Throat:     Mouth: Mucous membranes are moist.  Eyes:     Extraocular Movements: Extraocular movements intact.     Conjunctiva/sclera: Conjunctivae normal.     Pupils: Pupils are equal, round, and reactive to light.  Neck:     Thyroid: No thyromegaly.  Cardiovascular:     Rate and Rhythm: Normal rate and regular rhythm.  Pulses: Normal pulses.     Heart sounds: Normal heart sounds. No murmur heard.   Pulmonary:     Effort: Pulmonary effort is normal.     Breath sounds: Normal breath sounds.  Abdominal:     General: Abdomen is flat. Bowel sounds are normal. There is no distension.     Palpations: Abdomen is soft.     Tenderness:  There is no abdominal tenderness.  Musculoskeletal:     Cervical back: Normal range of motion and neck supple.  Lymphadenopathy:     Cervical: No cervical adenopathy.  Skin:    General: Skin is warm and dry.     Capillary Refill: Capillary refill takes less than 2 seconds.     Findings: No rash.  Neurological:     General: No focal deficit present.     Mental Status: He is alert and oriented to person, place, and time.     Cranial Nerves: No cranial nerve deficit.     Coordination: Coordination normal.     Deep Tendon Reflexes: Reflexes normal.  Psychiatric:        Mood and Affect: Mood normal.        Behavior: Behavior normal.      Office Visit from 01/07/2020 in [facility]  PHQ-2 Total Score 0         Assessment/plan: 1. Annual physical exam Routine lab work, will come back fasting. Hm reviewed and UTD. He will send covid vaccine via mychart or bring to clinic. Very, very fit and doing wonderful. Very proud of him. F/u in one year or as needed.  Patient counseling [x]    Nutrition: Stressed importance of moderation in sodium/caffeine intake, saturated fat and cholesterol, caloric balance, sufficient intake of fresh fruits, vegetables, fiber, calcium, iron, and 1 mg of folate supplement per day (for females capable of pregnancy).  [x]    Stressed the importance of regular exercise.   []    Substance Abuse: Discussed cessation/primary prevention of tobacco, alcohol, or other drug use; driving or other dangerous activities under the influence; availability of treatment for abuse.   [x]    Injury prevention: Discussed safety belts, safety helmets, smoke detector, smoking near bedding or upholstery.   [x]    Sexuality: Discussed sexually transmitted diseases, partner selection, use of condoms, avoidance of unintended pregnancy  and contraceptive alternatives.  [x]    Dental health: Discussed importance of regular tooth brushing, flossing, and dental visits.  [x]     Health maintenance and immunizations reviewed. Please refer to Health maintenance section.    - CBC with Differential/Platelet; Future - Comprehensive metabolic panel; Future - TSH; Future  2. Screening for prostate cancer  - PSA; Future  3. Mixed hyperlipidemia -high TG. Stopped his medication 6-8 weeks ago and has really increased exercise. Will see how fasting lipid panel looks.  - Lipid panel; Future  4. Vitamin D deficiency  - VITAMIN D 25 Hydroxy (Vit-D Deficiency, Fractures); Future   This visit occurred during the SARS-CoV-2 public health emergency.  Safety protocols were in place, including screening questions prior to the visit, additional usage of staff PPE, and extensive cleaning of exam room while observing appropriate contact time as indicated for disinfecting solutions.    Return in about 1 year (around 01/06/2021) for annual or as needed. Orma Flaming, MD [place]  01/07/2020

## 2020-01-07 NOTE — Telephone Encounter (Signed)
Please refill as per office routine med refill policy (all routine meds refilled for 3 mo or monthly per pt preference up to one year from last visit, then month to month grace period for 3 mo, then further med refills will have to be denied)  

## 2020-01-07 NOTE — Patient Instructions (Signed)
For shoulder pain along with tumeric can get voltaren over the counter. Can put on painful area 4x/day if needed.   -come back for fasting labs. No food for 8 hours. Water and black coffee okay.   Preventive Care 58-58 Years Old, Male Preventive care refers to lifestyle choices and visits with your health care provider that can promote health and wellness. This includes:  A yearly physical exam. This is also called an annual well check.  Regular dental and eye exams.  Immunizations.  Screening for certain conditions.  Healthy lifestyle choices, such as eating a healthy diet, getting regular exercise, not using drugs or products that contain nicotine and tobacco, and limiting alcohol use. What can I expect for my preventive care visit? Physical exam Your health care provider will check:  Height and weight. These may be used to calculate body mass index (BMI), which is a measurement that tells if you are at a healthy weight.  Heart rate and blood pressure.  Your skin for abnormal spots. Counseling Your health care provider may ask you questions about:  Alcohol, tobacco, and drug use.  Emotional well-being.  Home and relationship well-being.  Sexual activity.  Eating habits.  Work and work Statistician. What immunizations do I need?  Influenza (flu) vaccine  This is recommended every year. Tetanus, diphtheria, and pertussis (Tdap) vaccine  You may need a Td booster every 10 years. Varicella (chickenpox) vaccine  You may need this vaccine if you have not already been vaccinated. Zoster (shingles) vaccine  You may need this after age 48. Measles, mumps, and rubella (MMR) vaccine  You may need at least one dose of MMR if you were born in 1957 or later. You may also need a second dose. Pneumococcal conjugate (PCV13) vaccine  You may need this if you have certain conditions and were not previously vaccinated. Pneumococcal polysaccharide (PPSV23) vaccine  You may  need one or two doses if you smoke cigarettes or if you have certain conditions. Meningococcal conjugate (MenACWY) vaccine  You may need this if you have certain conditions. Hepatitis A vaccine  You may need this if you have certain conditions or if you travel or work in places where you may be exposed to hepatitis A. Hepatitis B vaccine  You may need this if you have certain conditions or if you travel or work in places where you may be exposed to hepatitis B. Haemophilus influenzae type b (Hib) vaccine  You may need this if you have certain risk factors. Human papillomavirus (HPV) vaccine  If recommended by your health care provider, you may need three doses over 6 months. You may receive vaccines as individual doses or as more than one vaccine together in one shot (combination vaccines). Talk with your health care provider about the risks and benefits of combination vaccines. What tests do I need? Blood tests  Lipid and cholesterol levels. These may be checked every 5 years, or more frequently if you are over 18 years old.  Hepatitis C test.  Hepatitis B test. Screening  Lung cancer screening. You may have this screening every year starting at age 20 if you have a 30-pack-year history of smoking and currently smoke or have quit within the past 15 years.  Prostate cancer screening. Recommendations will vary depending on your family history and other risks.  Colorectal cancer screening. All adults should have this screening starting at age 58 and continuing until age 40. Your health care provider may recommend screening at age 35 if  you are at increased risk. You will have tests every 1-10 years, depending on your results and the type of screening test.  Diabetes screening. This is done by checking your blood sugar (glucose) after you have not eaten for a while (fasting). You may have this done every 1-3 years.  Sexually transmitted disease (STD) testing. Follow these  instructions at home: Eating and drinking  Eat a diet that includes fresh fruits and vegetables, whole grains, lean protein, and low-fat dairy products.  Take vitamin and mineral supplements as recommended by your health care provider.  Do not drink alcohol if your health care provider tells you not to drink.  If you drink alcohol: ? Limit how much you have to 0-2 drinks a day. ? Be aware of how much alcohol is in your drink. In the [place] one drink equals one 12 oz bottle of beer (355 mL), one 5 oz glass of wine (148 mL), or one 1 oz glass of hard liquor (44 mL). Lifestyle  Take daily care of your teeth and gums.  Stay active. Exercise for at least 30 minutes on 5 or more days each week.  Do not use any products that contain nicotine or tobacco, such as cigarettes, e-cigarettes, and chewing tobacco. If you need help quitting, ask your health care provider.  If you are sexually active, practice safe sex. Use a condom or other form of protection to prevent STIs (sexually transmitted infections).  Talk with your health care provider about taking a low-dose aspirin every day starting at age 58. What's next?  Go to your health care provider once a year for a well check visit.  Ask your health care provider how often you should have your eyes and teeth checked.  Stay up to date on all vaccines. This information is not intended to replace advice given to you by your health care provider. Make sure you discuss any questions you have with your health care provider. Document Revised: 04/25/2018 Document Reviewed: 04/25/2018 Elsevier Patient Education  2020 [employer].

## 2020-01-08 ENCOUNTER — Encounter: Payer: 59 | Admitting: Family Medicine

## 2020-01-08 ENCOUNTER — Other Ambulatory Visit: Payer: 59

## 2020-01-08 DIAGNOSIS — Z125 Encounter for screening for malignant neoplasm of prostate: Secondary | ICD-10-CM

## 2020-01-08 DIAGNOSIS — Z0000 Encounter for general adult medical examination without abnormal findings: Secondary | ICD-10-CM

## 2020-01-08 DIAGNOSIS — E782 Mixed hyperlipidemia: Secondary | ICD-10-CM

## 2020-01-08 DIAGNOSIS — E559 Vitamin D deficiency, unspecified: Secondary | ICD-10-CM

## 2020-01-08 LAB — COMPREHENSIVE METABOLIC PANEL
AG Ratio: 1.6 (calc) (ref 1.0–2.5)
ALT: 16 U/L (ref 9–46)
AST: 15 U/L (ref 10–35)
Albumin: 4.4 g/dL (ref 3.6–5.1)
Alkaline phosphatase (APISO): 58 U/L (ref 35–144)
BUN: 18 mg/dL (ref 7–25)
CO2: 27 mmol/L (ref 20–32)
Calcium: 9.6 mg/dL (ref 8.6–10.3)
Chloride: 105 mmol/L (ref 98–110)
Creat: 1.04 mg/dL (ref 0.70–1.33)
Globulin: 2.7 g/dL (calc) (ref 1.9–3.7)
Glucose, Bld: 93 mg/dL (ref 65–99)
Potassium: 4.1 mmol/L (ref 3.5–5.3)
Sodium: 139 mmol/L (ref 135–146)
Total Bilirubin: 0.6 mg/dL (ref 0.2–1.2)
Total Protein: 7.1 g/dL (ref 6.1–8.1)

## 2020-01-08 LAB — CBC WITH DIFFERENTIAL/PLATELET
Absolute Monocytes: 310 cells/uL (ref 200–950)
Basophils Absolute: 30 cells/uL (ref 0–200)
Basophils Relative: 0.7 %
Eosinophils Absolute: 129 cells/uL (ref 15–500)
Eosinophils Relative: 3 %
HCT: 42.8 % (ref 38.5–50.0)
Hemoglobin: 14.1 g/dL (ref 13.2–17.1)
Lymphs Abs: 1780 cells/uL (ref 850–3900)
MCH: 28.9 pg (ref 27.0–33.0)
MCHC: 32.9 g/dL (ref 32.0–36.0)
MCV: 87.7 fL (ref 80.0–100.0)
MPV: 10.1 fL (ref 7.5–12.5)
Monocytes Relative: 7.2 %
Neutro Abs: 2051 cells/uL (ref 1500–7800)
Neutrophils Relative %: 47.7 %
Platelets: 137 10*3/uL — ABNORMAL LOW (ref 140–400)
RBC: 4.88 10*6/uL (ref 4.20–5.80)
RDW: 12.4 % (ref 11.0–15.0)
Total Lymphocyte: 41.4 %
WBC: 4.3 10*3/uL (ref 3.8–10.8)

## 2020-01-08 LAB — VITAMIN D 25 HYDROXY (VIT D DEFICIENCY, FRACTURES): Vit D, 25-Hydroxy: 25 ng/mL — ABNORMAL LOW (ref 30–100)

## 2020-01-08 LAB — LIPID PANEL
Cholesterol: 203 mg/dL — ABNORMAL HIGH (ref <200)
HDL: 51 mg/dL (ref >=40)
LDL Cholesterol (Calc): 127 mg/dL (calc) — ABNORMAL HIGH
Non-HDL Cholesterol (Calc): 152 mg/dL (calc) — ABNORMAL HIGH (ref <130)
Total CHOL/HDL Ratio: 4 (calc) (ref <5.0)
Triglycerides: 131 mg/dL (ref <150)

## 2020-01-08 LAB — PSA: PSA: 0.5 ng/mL (ref <=4.0)

## 2020-01-08 LAB — TSH: TSH: 1.84 mIU/L (ref 0.40–4.50)

## 2020-01-12 ENCOUNTER — Other Ambulatory Visit: Payer: Self-pay | Admitting: Family Medicine

## 2020-01-12 DIAGNOSIS — E559 Vitamin D deficiency, unspecified: Secondary | ICD-10-CM

## 2020-01-12 DIAGNOSIS — D696 Thrombocytopenia, unspecified: Secondary | ICD-10-CM

## 2020-01-12 DIAGNOSIS — E782 Mixed hyperlipidemia: Secondary | ICD-10-CM

## 2020-01-12 MED ORDER — VITAMIN D (ERGOCALCIFEROL) 1.25 MG (50000 UNIT) PO CAPS: ORAL_CAPSULE | ORAL | 0 refills | Status: DC

## 2020-02-09 ENCOUNTER — Other Ambulatory Visit: Payer: Self-pay | Admitting: Family Medicine

## 2020-02-09 NOTE — Telephone Encounter (Signed)
Pt requesting Vardenafil 10mg  tab LOV: 01/07/2020 Expired 01/07/2020  Okay to renew rx?

## 2020-02-29 ENCOUNTER — Other Ambulatory Visit: Payer: Self-pay | Admitting: Family Medicine

## 2020-05-18 ENCOUNTER — Other Ambulatory Visit: Payer: Self-pay

## 2020-05-18 ENCOUNTER — Encounter: Payer: Self-pay | Admitting: Family Medicine

## 2020-05-18 MED ORDER — OMEPRAZOLE 20 MG PO CPDR: 20.0000 mg | DELAYED_RELEASE_CAPSULE | Freq: Every day | ORAL | 3 refills | Status: DC

## 2020-11-12 ENCOUNTER — Telehealth: Payer: Self-pay

## 2020-11-12 NOTE — Telephone Encounter (Signed)
Refill request recevied from [facility].  Pt request refill on Imtrex.     LMOVM for pt to call and give the office a call to schedule a visit.

## 2020-12-29 NOTE — Progress Notes (Signed)
NEUROLOGY FOLLOW UP OFFICE NOTE  Jonathan Mills [id number]  Assessment/Plan:   Migraine without aura, without status migrainosus, not intractable.  Migraines are well controlled.  I have refilled his sumatriptan with refills.  However, his PCP may continue further refills going forward and he can follow up with me as needed.  Migraine prevention:  Not indicated Migraine rescue:  sumatriptan '50mg'$  as needed Limit use of pain relievers to no more than 2 days out of week to prevent risk of rebound or medication-overuse headache. Keep headache diary Follow up as needed.   Subjective:  Jonathan Mills is a 60 year-old male who follows up for migraine and tension type headache.   UPDATE: Last seen in July 2020. He has been doing well.  He got married in September.  He also retired a couple of months ago. Headaches are much improved.  They are mild to moderate, lasting several minutes with sumatriptan and occur every couple of months.  Sometimes, then may occur 2 to 3 days in a row but will be fine for many weeks.  He has found that he may have headaches in the days leading up to a vacation or for the first couple of days of the vacation.  So, he has learned to reduce stress at that time.    Current NSAIDS:  none Current analgesics:  none Current triptans: Sumatriptan '50mg'$  Current ergotamine: None Current anti-emetic: None Current muscle relaxants: None Current anti-anxiolytic: None Current sleep aide: None Current Antihypertensive medications: None Current Antidepressant medications: None Current Anticonvulsant medications: None Current anti-CGRP: None Current Vitamins/Herbal/Supplements: Magnesium 400 mg Current Antihistamines/Decongestants: None Other therapy: None   Caffeine: 1-2 cups of coffee daily Alcohol: Occasional Smoker: No Diet:  hydrates Exercise: No Depression: Improved; Anxiety: Proved Other pain: No Sleep hygiene: Able   HISTORY: Onset: History of tension type  headaches since 20s.  Migraine since 2016. Location:  Unilateral Quality:  sharp Initial Intensity:  9-10/10 Aura:  No Prodrome:  No Postdrome:  No Associated symptoms:  Nausea, photophobia, phonophobia, sometimes vomiting, tinnitus, nasal congestion/runny nose.   No visual disturbance, ptosis or conjunctival injection.  She has not had any new worse headache of her life, waking up from sleep Initial Duration:  1 hour intermittently for 2-3 days; Initial Frequency:  1 to 2 times a month Initial Frequency of abortive medication: 1-2 days a week (Tylenol for body aches) Triggers:  Certain smells (Tide laundry detergent); Possibly alcohol at times. Relieving factors:  None Activity:  Aggravates   He also has history of chronic tension-type headaches that have been resolved for several years.   Past NSAIDS:  Ibuprofen, naproxen Past analgesics:  Tylenol Past abortive triptans:  no Past muscle relaxants:  no Past anti-emetic:  no Past antihypertensive medications:  no Past antidepressant medications:  Lexapro, nortriptyline '10mg'$  (constipation) Past anticonvulsant medications:  lamotrigine, topiramate ER (trouble concentrating) Past vitamins/Herbal/Supplements:  no Other past therapies:  no   Family history of headache:  Mom had migraines Other family history:  Father: brain cancer (possibly GBM), maternal grandfather: Alzheimer's dementia.   Of note, he has endorsed memory deficits in the past.   He underwent neuropsychological testing on 02/12/17, which demonstrated depression but no cognitive impairment.   PAST MEDICAL HISTORY: Past Medical History:  Diagnosis Date   ALLERGIC RHINITIS 10/07/2008   COLONIC POLYPS, HX OF 10/07/2008   GERD 10/07/2008   Headache(784.0)    sinus   HYPERLIPIDEMIA 10/07/2008   Hypertriglyceridemia 07/12/2011   Mood swings  Vitamin D deficiency 07/12/2011    MEDICATIONS: Current Outpatient Medications on File Prior to Visit  Medication Sig Dispense  Refill   Apoaequorin (PREVAGEN EXTRA STRENGTH PO) Take 1 tablet by mouth daily. (Patient not taking: Reported on 01/07/2020)     Azelastine-Fluticasone 137-50 MCG/ACT SUSP Place 1 spray into the nose daily. 23 g 3   magnesium 30 MG tablet Take 30 mg by mouth 2 (two) times daily.     omeprazole (PRILOSEC) 20 MG capsule Take 1 capsule (20 mg total) by mouth daily. 90 capsule 3   SUMAtriptan (IMITREX) 100 MG tablet Take 1 tablet earliest onset of migraine.  May repeat once in 2 hours if headache persists or recurs. 10 tablet 3   vardenafil (LEVITRA) 10 MG tablet TAKE 1 TABLET BY MOUTH DAILY AS NEEDED FOR ERECTILE DYSFUNCTION 5 tablet 11   Vitamin D, Ergocalciferol, (DRISDOL) 1.25 MG (50000 UNIT) CAPS capsule ONE CAPSULE BY MOUTH ONCE A WEEK FOR 8 WEEKS. THEN TAKE 2000IU/DAY 4 capsule 1   No current facility-administered medications on file prior to visit.    ALLERGIES: Allergies  Allergen Reactions   Penicillins     REACTION: rash    FAMILY HISTORY: Family History  Problem Relation Age of Onset   Cancer Father       Objective:  Blood pressure 116/74, pulse (!) 59, height 6' (1.829 m), weight 181 lb 6.4 oz (82.3 kg), SpO2 96 %. General: No acute distress.  Patient appears well-groomed.   Head:  Normocephalic/atraumatic Eyes:  Fundi examined but not visualized Neck: supple, no paraspinal tenderness, full range of motion Heart:  Regular rate and rhythm Lungs:  Clear to auscultation bilaterally Back: No paraspinal tenderness Neurological Exam: alert and oriented to person, place, and time.  Speech fluent and not dysarthric, language intact.  CN II-XII intact. Bulk and tone normal, muscle strength 5/5 throughout.  Sensation to light touch intact.  Deep tendon reflexes 2+ throughout, toes downgoing.  Finger to nose testing intact.  Gait normal, Romberg negative.   Metta Clines, DO  CC: Orma Flaming, MD

## 2020-12-30 ENCOUNTER — Other Ambulatory Visit: Payer: Self-pay

## 2020-12-30 ENCOUNTER — Encounter: Payer: Self-pay | Admitting: Neurology

## 2020-12-30 ENCOUNTER — Ambulatory Visit (INDEPENDENT_AMBULATORY_CARE_PROVIDER_SITE_OTHER): Payer: 59 | Admitting: Neurology

## 2020-12-30 VITALS — BP 116/74 | HR 59 | Ht 72.0 in | Wt 181.4 lb

## 2020-12-30 DIAGNOSIS — G43009 Migraine without aura, not intractable, without status migrainosus: Secondary | ICD-10-CM

## 2020-12-30 MED ORDER — SUMATRIPTAN SUCCINATE 100 MG PO TABS: ORAL_TABLET | ORAL | 5 refills | Status: DC

## 2020-12-30 NOTE — Patient Instructions (Addendum)
  Continue magnesium citrate or oxide '400mg'$  daily.  Also consider riboflavin (B2) '400mg'$  daily and CoQ10 '100mg'$  three times daily Take sumatriptan '50mg'$  to '100mg'$  at earliest onset of headache.  May repeat dose once in 2 hours if needed.  Maximum 2 tablets in 24 hours. Limit use of pain relievers to no more than 2 days out of the week.  These medications include acetaminophen, NSAIDs (ibuprofen/Advil/Motrin, naproxen/Aleve, triptans (Imitrex/sumatriptan), Excedrin, and narcotics.  This will help reduce risk of rebound headaches. Be aware of common food triggers:  - Caffeine:  coffee, black tea, cola, Mt. Dew  - Chocolate  - Dairy:  aged cheeses (brie, blue, cheddar, gouda, [place], provolone, [place], Swiss, etc), chocolate milk, buttermilk, sour cream, limit eggs and yogurt  - Nuts, peanut butter  - Alcohol  - Cereals/grains:  FRESH breads (fresh bagels, sourdough, doughnuts), yeast productions  - Processed/canned/aged/cured meats (pre-packaged deli meats, hotdogs)  - MSG/glutamate:  soy sauce, flavor enhancer, pickled/preserved/marinated foods  - Sweeteners:  aspartame (Equal, Nutrasweet).  Sugar and Splenda are okay  - Vegetables:  legumes (lima beans, lentils, snow peas, fava beans, pinto peans, peas, garbanzo beans), sauerkraut, onions, olives, pickles  - Fruit:  avocados, bananas, citrus fruit (orange, lemon, grapefruit), mango  - Other:  Frozen meals, macaroni and cheese Routine exercise Stay adequately hydrated (aim for 64 oz water daily) Keep headache diary Maintain proper stress management Maintain proper sleep hygiene Do not skip meals 11. Refilled sumatriptan with 3 additional refills.  If your PCP doesn't have an objection, further refills afterwards can be prescribed by her.

## 2021-02-22 ENCOUNTER — Encounter: Payer: Self-pay | Admitting: Physician Assistant

## 2021-02-22 ENCOUNTER — Telehealth: Payer: 59 | Admitting: Physician Assistant

## 2021-02-22 DIAGNOSIS — R002 Palpitations: Secondary | ICD-10-CM | POA: Diagnosis not present

## 2021-02-22 NOTE — Patient Instructions (Signed)
  Jonathan Mills, thank you for joining [name], PA-C for today's virtual visit.  While this provider is not your primary care provider (PCP), if your PCP is located in our provider database this encounter information will be shared with them immediately following your visit.  Consent: (Patient) Jonathan Mills provided verbal consent for this virtual visit at the beginning of the encounter.  Current Medications:  Current Outpatient Medications:    Apoaequorin (PREVAGEN EXTRA STRENGTH PO), Take 1 tablet by mouth daily., Disp: , Rfl:    Azelastine-Fluticasone 137-50 MCG/ACT SUSP, Place 1 spray into the nose daily., Disp: 23 g, Rfl: 3   magnesium 30 MG tablet, Take 30 mg by mouth 2 (two) times daily., Disp: , Rfl:    omeprazole (PRILOSEC) 20 MG capsule, Take 1 capsule (20 mg total) by mouth daily., Disp: 90 capsule, Rfl: 3   SUMAtriptan (IMITREX) 100 MG tablet, Take 1 tablet earliest onset of migraine.  May repeat once in 2 hours if headache persists or recurs., Disp: 10 tablet, Rfl: 5   vardenafil (LEVITRA) 10 MG tablet, TAKE 1 TABLET BY MOUTH DAILY AS NEEDED FOR ERECTILE DYSFUNCTION, Disp: 5 tablet, Rfl: 11   Vitamin D, Ergocalciferol, (DRISDOL) 1.25 MG (50000 UNIT) CAPS capsule, ONE CAPSULE BY MOUTH ONCE A WEEK FOR 8 WEEKS. THEN TAKE 2000IU/DAY, Disp: 4 capsule, Rfl: 1   Medications ordered in this encounter:  No orders of the defined types were placed in this encounter.    *If you need refills on other medications prior to your next appointment, please contact your pharmacy*  Follow-Up: Call back or seek an in-person evaluation if the symptoms worsen or if the condition fails to improve as anticipated.  Other Instructions Please contact your primary care office --[facility] -- at [phone] to get set up for an appointment for further evaluation.  Any new or worsening symptoms before this, you need to be seen ASAP at local UC or ER if any chest pain or shortness of  breath.    If you have been instructed to have an in-person evaluation today at a local Urgent Care facility, please use the link below. It will take you to a list of all of our available [facility], including address, phone number and hours of operation. Please do not delay care.  [facility]  If you or a family member do not have a primary care provider, use the link below to schedule a visit and establish care. When you choose a [facility] primary care physician or advanced practice provider, you gain a long-term partner in health. Find a Primary Care Provider  Learn more about [facility] in-office and virtual care options: [facility]

## 2021-02-22 NOTE — Progress Notes (Signed)
Virtual Visit Consent   Jonathan Mills, you are scheduled for a virtual visit with a [facility] provider today.     Just as with appointments in the office, your consent must be obtained to participate.  Your consent will be active for this visit and any virtual visit you may have with one of our providers in the next 365 days.     If you have a MyChart account, a copy of this consent can be sent to you electronically.  All virtual visits are billed to your insurance company just like a traditional visit in the office.    As this is a virtual visit, video technology does not allow for your provider to perform a traditional examination.  This may limit your provider's ability to fully assess your condition.  If your provider identifies any concerns that need to be evaluated in person or the need to arrange testing (such as labs, EKG, etc.), we will make arrangements to do so.     Although advances in technology are sophisticated, we cannot ensure that it will always work on either your end or our end.  If the connection with a video visit is poor, the visit may have to be switched to a telephone visit.  With either a video or telephone visit, we are not always able to ensure that we have a secure connection.     I need to obtain your verbal consent now.   Are you willing to proceed with your visit today?    [name] has provided verbal consent on 02/22/2021 for a virtual visit (video or telephone).   [name], [place]   Date: 02/22/2021 2:26 PM   Virtual Visit via Video Note   I, [name], connected with  NATASHA PAULSON  ([id number], [birth year]) on 02/22/21 at  2:00 PM EDT by a video-enabled telemedicine application and verified that I am speaking with the correct person using two identifiers.  Location: Patient: Virtual Visit Location Patient: Home Provider: Virtual Visit Location Provider: Home Office   I discussed the limitations of evaluation and management by  telemedicine and the availability of in person appointments. The patient expressed understanding and agreed to proceed.    History of Present Illness: Jonathan Mills is a 60 y.o. who identifies as a male who was assigned male at birth, and is being seen today for intermittent episodes of heart palpitations. Endorses this has been present for about a year or so. Notes these episodes of his heart beating harder than normal, not necessarily faster but he can feel his heart beating in his chest. Previously was occurring about 1-2 x month but over the past year has become more frequent. Now multiple times per day. Lasts anywhere from 15 minutes to an hour at times. Can feel his heart beat in his ears during these episodes. Denies association with anxiety. Has episodes of orthostatic lightheadedness but not associated with these episodes of heart pounding. Denies change in diet or activity level. Is keeping well-hydrated overall. Works out more regularly now -- no issue while working out.  Does have history of hypertriglyceridemia.  Has upcoming rotator cuff surgery with full anesthesia.   BP Readings from Last 3 Encounters:  12/30/20 116/74  01/07/20 110/68  02/05/19 117/80   HPI: HPI  Problems:  Patient Active Problem List   Diagnosis Date Noted   Migraines 02/05/2019   Memory difficulty 10/28/2014   Vitamin D deficiency 07/12/2011   Hyperlipidemia 10/07/2008  Allergic rhinitis 10/07/2008   GERD 10/07/2008   COLONIC POLYPS, HX OF 10/07/2008    Allergies:  Allergies  Allergen Reactions   Penicillins     REACTION: rash   Medications:  Current Outpatient Medications:    Azelastine-Fluticasone 137-50 MCG/ACT SUSP, Place 1 spray into the nose daily., Disp: 23 g, Rfl: 3   magnesium 30 MG tablet, Take 30 mg by mouth 2 (two) times daily., Disp: , Rfl:    omeprazole (PRILOSEC) 20 MG capsule, Take 1 capsule (20 mg total) by mouth daily., Disp: 90 capsule, Rfl: 3   SUMAtriptan (IMITREX) 100 MG tablet,  Take 1 tablet earliest onset of migraine.  May repeat once in 2 hours if headache persists or recurs., Disp: 10 tablet, Rfl: 5   vardenafil (LEVITRA) 10 MG tablet, TAKE 1 TABLET BY MOUTH DAILY AS NEEDED FOR ERECTILE DYSFUNCTION, Disp: 5 tablet, Rfl: 11   Vitamin D, Ergocalciferol, (DRISDOL) 1.25 MG (50000 UNIT) CAPS capsule, ONE CAPSULE BY MOUTH ONCE A WEEK FOR 8 WEEKS. THEN TAKE 2000IU/DAY, Disp: 4 capsule, Rfl: 1  Observations/Objective: Patient is well-developed, well-nourished in no acute distress.  Resting comfortably at home.  Head is normocephalic, atraumatic.  No labored breathing. Speech is clear and coherent with logical content.  Patient is alert and oriented at baseline.   Assessment and Plan: 1. Pounding heartbeat Initially rare. Now occurring almost daily for several months. No associated lightheadedness/dizziness, chest pain or SOB but does have occasional episodes of orthostatic lightheadedness outside of these heart "spells" Upcoming major surgery. On review on prior BP -- all within normal range. Last EKG looked good but in 2019. Only known risk factor of HLD. Needs further assessment than can be provided via video. He is encouraged to contact PCP office for evaluation. Strict UC/ER precautions reviewed.   Follow Up Instructions: I discussed the assessment and treatment plan with the patient. The patient was provided an opportunity to ask questions and all were answered. The patient agreed with the plan and demonstrated an understanding of the instructions.  A copy of instructions were sent to the patient via MyChart unless otherwise noted below.   The patient was advised to call back or seek an in-person evaluation if the symptoms worsen or if the condition fails to improve as anticipated.  Time:  I spent 12 minutes with the patient via telehealth technology discussing the above problems/concerns.    [name], PA-C

## 2021-02-23 ENCOUNTER — Ambulatory Visit (INDEPENDENT_AMBULATORY_CARE_PROVIDER_SITE_OTHER): Payer: 59 | Admitting: Family

## 2021-02-23 ENCOUNTER — Encounter: Payer: Self-pay | Admitting: Family

## 2021-02-23 ENCOUNTER — Other Ambulatory Visit: Payer: Self-pay

## 2021-02-23 DIAGNOSIS — R002 Palpitations: Secondary | ICD-10-CM | POA: Diagnosis not present

## 2021-02-23 DIAGNOSIS — M79671 Pain in right foot: Secondary | ICD-10-CM

## 2021-02-23 NOTE — Assessment & Plan Note (Signed)
Lateral foot, notable lump, firm and tender. Pt feels pain with walking, will send for xray, but advised to f/u with podiatry as may need custom insoles or proper fitting shoes.

## 2021-02-23 NOTE — Progress Notes (Addendum)
Acute Office Visit  Subjective:    Patient ID: Jonathan Mills, male    DOB: 1960-07-27, 60 y.o.   MRN: [id number]  Chief Complaint  Patient presents with   Pre-op Exam    Pt will be having surgery in a couple of weeks, and was told he needs an EKG. He is not currently having Palpitations.    Foot Pain    He states he's been in the gym a lot more.     HPI Patient is in today for f/u from virtual visit yesterday regarding his heart palpitations. He last reports having an episode this am, most last only around 5-10 min, occasionally up to an hour. Denies increased HR, chest pain, tightness, nausea, or SOB. Denies any anxiety, states he is newly retired and married, very happy, no negative triggers for this time of year, and he is not anxious about his upcoming shoulder surgery, has had surgery in past without any complications. Also reporting a new tender lump on lateral right foot that popped up since he has been walking on a treadmill and outside.  Past Medical History:  Diagnosis Date   ALLERGIC RHINITIS 10/07/2008   COLONIC POLYPS, HX OF 10/07/2008   GERD 10/07/2008   Headache(784.0)    sinus   HYPERLIPIDEMIA 10/07/2008   Hypertriglyceridemia 07/12/2011   Mood swings    Vitamin D deficiency 07/12/2011    Past Surgical History:  Procedure Laterality Date   EYE SURGERY     LASIK     right ear/neck surgury  1997   SHOULDER ARTHROSCOPY WITH SUBACROMIAL DECOMPRESSION, ROTATOR CUFF REPAIR AND BICEP TENDON REPAIR Left 12/18/2013   Procedure: LEFT SHOULDER ARTHROSCOPY WITH SUBACROMIAL DECOMPRESSION,DISTAL CLAVICAL RESECTION ROTATOR CUFF REPAIR ;  Surgeon: Marin Shutter, MD;  Location: [facility];  Service: Orthopedics;  Laterality: Left;     Outpatient Medications Prior to Visit  Medication Sig Dispense Refill   Azelastine-Fluticasone 137-50 MCG/ACT SUSP Place 1 spray into the nose daily. 23 g 3   cholecalciferol (VITAMIN D3) 25 MCG (1000 UNIT) tablet Take 1,000 Units by mouth daily.      magnesium 30 MG tablet Take 30 mg by mouth 2 (two) times daily.     omeprazole (PRILOSEC) 20 MG capsule Take 1 capsule (20 mg total) by mouth daily. 90 capsule 3   SUMAtriptan (IMITREX) 100 MG tablet Take 1 tablet earliest onset of migraine.  May repeat once in 2 hours if headache persists or recurs. 10 tablet 5   vardenafil (LEVITRA) 10 MG tablet TAKE 1 TABLET BY MOUTH DAILY AS NEEDED FOR ERECTILE DYSFUNCTION 5 tablet 11   Vitamin D, Ergocalciferol, (DRISDOL) 1.25 MG (50000 UNIT) CAPS capsule ONE CAPSULE BY MOUTH ONCE A WEEK FOR 8 WEEKS. THEN TAKE 2000IU/DAY (Patient not taking: Reported on 02/23/2021) 4 capsule 1   No facility-administered medications prior to visit.    Allergies  Allergen Reactions   Penicillins     REACTION: rash    Review of Systems See HPI above.     Objective:    Physical Exam Vitals and nursing note reviewed.  Constitutional:      General: He is not in acute distress.    Appearance: Normal appearance.  HENT:     Head: Normocephalic.  Cardiovascular:     Rate and Rhythm: Normal rate and regular rhythm.  Pulmonary:     Effort: Pulmonary effort is normal.     Breath sounds: Normal breath sounds.  Musculoskeletal:     Cervical back:  Normal range of motion.     Right foot: Decreased range of motion. Bunion (lateral) present.       Feet:  Feet:     Right foot:     Skin integrity: Skin integrity normal.  Skin:    General: Skin is warm and dry.  Neurological:     Mental Status: He is alert and oriented to person, place, and time.  Psychiatric:        Mood and Affect: Mood normal.   BP 124/82   Pulse 68   Temp 97.8 F (36.6 C) (Temporal)   Ht 6' (1.829 m)   Wt 177 lb 12.8 oz (80.6 kg)   SpO2 99%   BMI 24.11 kg/m  Wt Readings from Last 3 Encounters:  02/23/21 177 lb 12.8 oz (80.6 kg)  12/30/20 181 lb 6.4 oz (82.3 kg)  01/07/20 97 lb 11.2 oz (44.3 kg)    There are no preventive care reminders to display for this patient.      Lab  Results  Component Value Date   TSH 1.84 01/08/2020   Lab Results  Component Value Date   WBC 4.3 01/08/2020   HGB 14.1 01/08/2020   HCT 42.8 01/08/2020   MCV 87.7 01/08/2020   PLT 137 (L) 01/08/2020   Lab Results  Component Value Date   NA 139 01/08/2020   K 4.1 01/08/2020   CO2 27 01/08/2020   GLUCOSE 93 01/08/2020   BUN 18 01/08/2020   CREATININE 1.04 01/08/2020   BILITOT 0.6 01/08/2020   ALKPHOS 46 02/14/2019   AST 15 01/08/2020   ALT 16 01/08/2020   PROT 7.1 01/08/2020   ALBUMIN 4.5 02/14/2019   CALCIUM 9.6 01/08/2020   ANIONGAP 13 12/15/2013   GFR 63.60 02/14/2019   Lab Results  Component Value Date   CHOL 203 (H) 01/08/2020   Lab Results  Component Value Date   HDL 51 01/08/2020   Lab Results  Component Value Date   LDLCALC 127 (H) 01/08/2020   Lab Results  Component Value Date   TRIG 131 01/08/2020   Lab Results  Component Value Date   CHOLHDL 4.0 01/08/2020   Lab Results  Component Value Date   HGBA1C 5.6 09/18/2011       Assessment & Plan:   Problem List Items Addressed This Visit       Other   Heart palpitations    ECG indicates RSR in v1, mild bradycardia. Reassured pt of findings. He is exercising more and has lost weight, believe he is feeling the sensation of his heart beating more than previously. Time spent reviewing his virtual visit yesterday for same problem. Advised him of concerning symptoms to follow up on and with any chest pain or increased SOB, go to the ER.      Relevant Orders   EKG 12-Lead (Completed)   Acute pain of right foot    Lateral foot, notable lump, firm and tender. Pt feels pain with walking, will send for xray, but advised to f/u with podiatry as may need custom insoles or proper fitting shoes.      Relevant Orders   DG Foot Complete Right (Completed)   Ambulatory referral to Podiatry     No orders of the defined types were placed in this encounter.    Jeanie Sewer, NP

## 2021-02-23 NOTE — Assessment & Plan Note (Addendum)
ECG indicates RSR in v1, mild bradycardia. Reassured pt of findings. He is exercising more and has lost weight, believe he is feeling the sensation of his heart beating more than previously. Time spent reviewing his virtual visit yesterday for same problem. Advised him of concerning symptoms to follow up on and with any chest pain or increased SOB, go to the ER.

## 2021-02-23 NOTE — Patient Instructions (Signed)
Your EKG looks fine today. If you develop any other symptoms  (as we discussed) with your heart palpitations, please call the office back, or go to the ER with any chest pain or severe shortness of breath. Continue to exercise and hydrate daily with at least 64oz water. We have ordered an xray for your foot, the address is on this AVS, you can go anytime when convenient for you, they do not call to schedule. A podiatry referral has also been ordered, and they will call you directly with an appointment. Schedule a physical with fasting labs in the next 1-2 weeks.

## 2021-02-24 ENCOUNTER — Encounter: Payer: Self-pay | Admitting: Family

## 2021-02-24 ENCOUNTER — Ambulatory Visit (INDEPENDENT_AMBULATORY_CARE_PROVIDER_SITE_OTHER)
Admission: RE | Admit: 2021-02-24 | Discharge: 2021-02-24 | Disposition: A | Discharge status: Home / Self Care | Payer: 59 | Source: Ambulatory Visit | Attending: Family | Admitting: Family

## 2021-02-24 ENCOUNTER — Other Ambulatory Visit: Payer: Self-pay

## 2021-02-24 DIAGNOSIS — R002 Palpitations: Secondary | ICD-10-CM

## 2021-02-24 DIAGNOSIS — M79671 Pain in right foot: Secondary | ICD-10-CM | POA: Diagnosis not present

## 2021-02-28 NOTE — Addendum Note (Signed)
Addended byJeanie Sewer on: 02/28/2021 12:45 PM   Modules accepted: Level of Service

## 2021-03-03 ENCOUNTER — Encounter: Payer: Self-pay | Admitting: Podiatry

## 2021-03-03 ENCOUNTER — Ambulatory Visit (INDEPENDENT_AMBULATORY_CARE_PROVIDER_SITE_OTHER): Payer: 59

## 2021-03-03 ENCOUNTER — Ambulatory Visit (INDEPENDENT_AMBULATORY_CARE_PROVIDER_SITE_OTHER): Payer: 59 | Admitting: Podiatry

## 2021-03-03 ENCOUNTER — Other Ambulatory Visit: Payer: Self-pay

## 2021-03-03 DIAGNOSIS — M779 Enthesopathy, unspecified: Secondary | ICD-10-CM

## 2021-03-03 DIAGNOSIS — M79671 Pain in right foot: Secondary | ICD-10-CM

## 2021-03-03 DIAGNOSIS — M7671 Peroneal tendinitis, right leg: Secondary | ICD-10-CM

## 2021-03-03 MED ORDER — TRIAMCINOLONE ACETONIDE 10 MG/ML IJ SUSP
10.0000 mg | Freq: Once | INTRAMUSCULAR | Status: AC
  Administered 2021-03-03: 10 mg

## 2021-03-03 NOTE — Progress Notes (Signed)
Subjective:   Patient ID: Jonathan Mills, male   DOB: 60 y.o.   MRN: [id number]   HPI Patient presents with a lot of pain in the outside of his right foot that has been present for around 6 months and he is trying to be more active and this makes it difficult.  Patient does not smoke currently and again attempts activities but this is making it harder for him and he is due to have rotator cuff surgery on his shoulder next week   Review of Systems  All other systems reviewed and are negative.      Objective:  Physical Exam Vitals and nursing note reviewed.  Constitutional:      Appearance: He is well-developed.  Pulmonary:     Effort: Pulmonary effort is normal.  Musculoskeletal:        General: Normal range of motion.  Skin:    General: Skin is warm.  Neurological:     Mental Status: He is alert.    Neurovascular status intact muscle strength found to be adequate range of motion within normal limits with exquisite discomfort outside of her right foot at the insertion of the tendon into the base of the fifth metatarsal with no indication of tendon loss of strength or muscle strength due to this condition with testing done today.  Good digital perfusion well oriented x3     Assessment:  Acute peroneal tendinitis right with inflammation fluid buildup at insertion     Plan:  H&P reviewed condition sterile prep and injected the tendon insertion 3 mg dexamethasone Kenalog 5 mg Xylocaine advised on ice anti-inflammatories and reappoint to recheck and placed and fascial brace.  Discussed possibility for immobilization or MRI depending on how he responds to this conservative treatments.  Review of his x-rays did not indicate fracture but they are nonweightbearing and may need to be repeated

## 2021-03-04 HISTORY — PX: SHOULDER SURGERY: SHX246

## 2021-03-16 ENCOUNTER — Other Ambulatory Visit: Payer: Self-pay | Admitting: Podiatry

## 2021-03-16 DIAGNOSIS — M779 Enthesopathy, unspecified: Secondary | ICD-10-CM

## 2021-03-24 ENCOUNTER — Ambulatory Visit: Payer: 59 | Admitting: Podiatry

## 2021-05-01 NOTE — Progress Notes (Signed)
Cardiology Office Note:    Date:  05/05/2021   ID:  Jonathan Mills, DOB [birth year], MRN [id number]  PCP:  Pcp, No  Cardiologist:  Donato Heinz, MD  Electrophysiologist:  None   Referring MD: Jeanie Sewer, NP   Chief Complaint  Patient presents with   Palpitations    History of Present Illness:    Jonathan Mills is a 60 y.o. male with a hx of hyperlipidemia who is referred by Jeanie Sewer, NP for evaluation of palpitations.  He reports that he has been having palpitations for last 4 to 5 years.  At first palpitations occurred rarely but once he retired in summer 2022 reported started occurring more frequently.  Thinks it may have been related to drinking more coffee since he retired.  He was drinking 3 cups of coffee per day and 3-4 diet [employer].  He switched to caffeine free [facility] and is now drinking only 1 cup of coffee per day.  Has noted improvement in palpitations.  Was occurring multiple times per day, has now had 3-4 episodes over the last 2 weeks.  Episodes typically last less than 5 minutes but has lasted up to 45 minutes.  States that it feels like heart is pounding during episodes.  He denies any chest pain, dyspnea, headedness, syncope, or lower extremity edema.  He has to the gym 2-3 times per week, walks on treadmill for 30 to 60 minutes.  No smoking history.  Does smoke marijuana a few times per month.  No known history of heart disease in his immediate family.    Past Medical History:  Diagnosis Date   ALLERGIC RHINITIS 10/07/2008   COLONIC POLYPS, HX OF 10/07/2008   GERD 10/07/2008   Headache(784.0)    sinus   HYPERLIPIDEMIA 10/07/2008   Hypertriglyceridemia 07/12/2011   Mood swings    Vitamin D deficiency 07/12/2011    Past Surgical History:  Procedure Laterality Date   EYE SURGERY     LASIK     right ear/neck surgury  1997   SHOULDER ARTHROSCOPY WITH SUBACROMIAL DECOMPRESSION, ROTATOR CUFF REPAIR AND BICEP TENDON REPAIR Left 12/18/2013    Procedure: LEFT SHOULDER ARTHROSCOPY WITH SUBACROMIAL DECOMPRESSION,DISTAL CLAVICAL RESECTION ROTATOR CUFF REPAIR ;  Surgeon: Marin Shutter, MD;  Location: [facility];  Service: Orthopedics;  Laterality: Left;    Current Medications: Current Meds  Medication Sig   Azelastine-Fluticasone 137-50 MCG/ACT SUSP Place 1 spray into the nose daily.   cholecalciferol (VITAMIN D3) 25 MCG (1000 UNIT) tablet Take 1,000 Units by mouth daily.   magnesium 30 MG tablet Take 30 mg by mouth 2 (two) times daily.   naproxen (NAPROSYN) 500 MG tablet Take 500 mg by mouth 2 (two) times daily.   omeprazole (PRILOSEC) 20 MG capsule Take 1 capsule (20 mg total) by mouth daily.   oxyCODONE-acetaminophen (PERCOCET/ROXICET) 5-325 MG tablet Take 1 tablet by mouth every 4 (four) hours as needed.   SUMAtriptan (IMITREX) 100 MG tablet Take 1 tablet earliest onset of migraine.  May repeat once in 2 hours if headache persists or recurs.   vardenafil (LEVITRA) 10 MG tablet TAKE 1 TABLET BY MOUTH DAILY AS NEEDED FOR ERECTILE DYSFUNCTION     Allergies:   Penicillins   Social History   Socioeconomic History   Marital status: Married    Spouse name: Not on file   Number of children: Not on file   Years of education: BS   Highest education level: Not on file  Occupational History   Occupation: IT  Tobacco Use   Smoking status: Never   Smokeless tobacco: Never  Vaping Use   Vaping Use: Never used  Substance and Sexual Activity   Alcohol use: Yes    Alcohol/week: 1.0 standard drink    Types: 1 Cans of beer per week   Drug use: No   Sexual activity: Not on file  Other Topics Concern   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  ROS:   Please see the history of present illness.     All other systems reviewed and are negative.  EKGs/Labs/Other Studies Reviewed:    The following studies were reviewed today:   EKG:  05/04/21: Normal sinus rhythm, rate 63, no ST abnormality  Recent Labs: 05/04/2021: BUN 17; Creatinine, Ser 0.98; Hemoglobin 14.2; Magnesium 2.1; Platelets 143; Potassium 4.3; Sodium 140; TSH 1.700  Recent Lipid Panel    Component Value Date/Time   CHOL 210 (H) 05/04/2021 1048   TRIG 247 (H) 05/04/2021 1048   HDL 52 05/04/2021 1048   CHOLHDL 4.0 05/04/2021 1048   CHOLHDL 4.0 01/08/2020 0816   VLDL 11.4 02/14/2019 0926   LDLCALC 115 (H) 05/04/2021 1048   LDLCALC 127 (H) 01/08/2020 0816   LDLDIRECT 47.8 07/06/2011 1646    Physical Exam:    VS:  BP 106/78 (BP Location: Left Arm, Patient Position: Sitting, Cuff Size: Normal)    Pulse 63    Ht 6' (1.829 m)    Wt 177 lb 3.2 oz (80.4 kg)    SpO2 98%    BMI 24.03 kg/m     Wt Readings from Last 3 Encounters:  05/04/21 177 lb 3.2 oz (80.4 kg)  02/23/21 177 lb 12.8 oz (80.6 kg)  12/30/20 181 lb 6.4 oz (82.3 kg)     GEN:  Well nourished, well developed in no acute distress HEENT: Normal NECK: No JVD; No carotid bruits LYMPHATICS: No lymphadenopathy CARDIAC: RRR, no murmurs, rubs, gallops RESPIRATORY:  Clear to auscultation without rales, wheezing or rhonchi  ABDOMEN: Soft, non-tender, non-distended MUSCULOSKELETAL:  No edema; No deformity  SKIN: Warm and dry NEUROLOGIC:  Alert and oriented x 3 PSYCHIATRIC:  Normal affect   ASSESSMENT:    1. Palpitations   2. Hyperlipidemia, unspecified hyperlipidemia type    PLAN:     Palpitations: Description concerning for arrhythmia, will evaluate with Zio patch x2 weeks.  Will check echocardiogram to rule out structural heart disease.  Will check TSH and electrolytes.  Hyperlipidemia: LDL 127 on 01/08/2019.  Will  repeat lipid panel.  Will check calcium score to guide how aggressive to be in lowering cholesterol.   RTC in 6 months   Medication Adjustments/Labs and Tests Ordered: Current medicines are reviewed at length with the patient today.  Concerns regarding medicines are outlined above.  Orders Placed This Encounter  Procedures   CT CARDIAC SCORING (SELF PAY ONLY)   Magnesium   Lipid panel   Basic metabolic panel   CBC   TSH   LONG TERM MONITOR (3-14 DAYS)   EKG 12-Lead  ECHOCARDIOGRAM COMPLETE   No orders of the defined types were placed in this encounter.   Patient Instructions  Medication Instructions:  No Changes In Medications at this time.  *If you need a refill on your cardiac medications before your next appointment, please call your pharmacy*  Lab Work: BMET, TSH, LIPID, MAG, CBC- TODAY  If you have labs (blood work) drawn today and your tests are completely normal, you will receive your results only by: [place] (if you have MyChart) OR A paper copy in the mail If you have any lab test that is abnormal or we need to change your treatment, we will call you to review the results.  Testing/Procedures: Your physician has requested that you have an echocardiogram. Echocardiography is a painless test that uses sound waves to create images of your heart. It provides your doctor with information about the size and shape of your heart and how well your hearts chambers and valves are working. You may receive an ultrasound enhancing agent through an IV if needed to better visualize your heart during the echo.This procedure takes approximately one hour. There are no restrictions for this procedure. This will take place at the [address], [address].   Dr. Gardiner Rhyme has ordered a CT coronary calcium score. This test is done at [address]. This is $99 out of pocket.   Coronary CalciumScan A coronary calcium scan is an imaging test used to look for  deposits of calcium and other fatty materials (plaques) in the inner lining of the blood vessels of the heart (coronary arteries). These deposits of calcium and plaques can partly clog and narrow the coronary arteries without producing any symptoms or warning signs. This puts a person at risk for a heart attack. This test can detect these deposits before symptoms develop. Tell a health care provider about: Any allergies you have. All medicines you are taking, including vitamins, herbs, eye drops, creams, and over-the-counter medicines. Any problems you or family members have had with anesthetic medicines. Any blood disorders you have. Any surgeries you have had. Any medical conditions you have. Whether you are pregnant or may be pregnant. What are the risks? Generally, this is a safe procedure. However, problems may occur, including: Harm to a pregnant woman and her unborn baby. This test involves the use of radiation. Radiation exposure can be dangerous to a pregnant woman and her unborn baby. If you are pregnant, you generally should not have this procedure done. Slight increase in the risk of cancer. This is because of the radiation involved in the test. What happens before the procedure? No preparation is needed for this procedure. What happens during the procedure? You will undress and remove any jewelry around your neck or chest. You will put on a hospital gown. Sticky electrodes will be placed on your chest. The electrodes will be connected to an electrocardiogram (ECG) machine to record a tracing of the electrical activity of your heart. A CT scanner will take pictures of your heart. During this time, you will be asked to lie still and hold your breath for 2-3 seconds while a picture of your heart is being taken. The procedure may vary among health care providers and hospitals. What happens after the procedure? You can get dressed. You can return to your normal activities. It is up to  you to get the results of your test. Ask your health care provider, or the department that is doing the test, when  your results will be ready. Summary A coronary calcium scan is an imaging test used to look for deposits of calcium and other fatty materials (plaques) in the inner lining of the blood vessels of the heart (coronary arteries). Generally, this is a safe procedure. Tell your health care provider if you are pregnant or may be pregnant. No preparation is needed for this procedure. A CT scanner will take pictures of your heart. You can return to your normal activities after the scan is done. This information is not intended to replace advice given to you by your health care provider. Make sure you discuss any questions you have with your health care provider. Document Released: 10/28/2007 Document Revised: 03/20/2016 Document Reviewed: 03/20/2016 Elsevier Interactive Patient Education  2017 [employer] Term Monitor Instructions   Your physician has requested you wear your ZIO patch monitor 14 days.   This is a single patch monitor.  [employer] supplies one patch monitor per enrollment.  Additional stickers are not available.   Please do not apply patch if you will be having a Nuclear Stress Test, Echocardiogram, Cardiac CT, MRI, or Chest Xray during the time frame you would be wearing the monitor. The patch cannot be worn during these tests.  You cannot remove and re-apply the ZIO XT patch monitor.   Your ZIO patch monitor will be sent USPS Priority mail from [facility] directly to your home address. The monitor may also be mailed to a PO BOX if home delivery is not available.   It may take 3-5 days to receive your monitor after you have been enrolled.   Once you have received you monitor, please review enclosed instructions.  Your monitor has already been registered assigning a specific monitor serial # to you.   Applying the monitor   Shave hair from upper  left chest.   Hold abrader disc by orange tab.  Rub abrader in 40 strokes over left upper chest as indicated in your monitor instructions.   Clean area with 4 enclosed alcohol pads .  Use all pads to assure are is cleaned thoroughly.  Let dry.   Apply patch as indicated in monitor instructions.  Patch will be place under collarbone on left side of chest with arrow pointing upward.   Rub patch adhesive wings for 2 minutes.Remove white label marked "1".  Remove white label marked "2".  Rub patch adhesive wings for 2 additional minutes.   While looking in a mirror, press and release button in center of patch.  A small green light will flash 3-4 times .  This will be your only indicator the monitor has been turned on.     Do not shower for the first 24 hours.  You may shower after the first 24 hours.   Press button if you feel a symptom. You will hear a small click.  Record Date, Time and Symptom in the Patient Log Book.   When you are ready to remove patch, follow instructions on last 2 pages of Patient Log Book.  Stick patch monitor onto last page of Patient Log Book.   Place Patient Log Book in [place] box.  Use locking tab on box and tape box closed securely.  The [employer] has [employer] on it.  Please place in mailbox as soon as possible.  Your physician should have your test results approximately 7 days after the monitor has been mailed back to [facility].   Call [employer] Customer Care at [phone] if you have questions regarding your ZIO XT patch monitor.  Call them immediately if you see an orange light blinking on your monitor.   If your monitor falls off in less than 4 days contact our Monitor department at [phone].  If your monitor becomes loose or falls off after 4 days call [employer] at [phone] for suggestions on securing your monitor.   Follow-Up: At [facility], you and your health needs are our priority.  As part of our continuing mission  to provide you with exceptional heart care, we have created designated Provider Care Teams.  These Care Teams include your primary Cardiologist (physician) and Advanced Practice Providers (APPs -  Physician Assistants and Nurse Practitioners) who all work together to provide you with the care you need, when you need it.  Your next appointment:   6 month(s)  The format for your next appointment:   In Person  Provider:   Donato Heinz, MD     Signed, Donato Heinz, MD  05/05/2021 9:27 AM    [place]

## 2021-05-04 ENCOUNTER — Encounter: Payer: Self-pay | Admitting: Cardiology

## 2021-05-04 ENCOUNTER — Ambulatory Visit (INDEPENDENT_AMBULATORY_CARE_PROVIDER_SITE_OTHER): Payer: 59

## 2021-05-04 ENCOUNTER — Ambulatory Visit (INDEPENDENT_AMBULATORY_CARE_PROVIDER_SITE_OTHER): Payer: 59 | Admitting: Cardiology

## 2021-05-04 ENCOUNTER — Other Ambulatory Visit: Payer: Self-pay

## 2021-05-04 VITALS — BP 106/78 | HR 63 | Ht 72.0 in | Wt 177.2 lb

## 2021-05-04 DIAGNOSIS — E785 Hyperlipidemia, unspecified: Secondary | ICD-10-CM

## 2021-05-04 DIAGNOSIS — R002 Palpitations: Secondary | ICD-10-CM | POA: Diagnosis not present

## 2021-05-04 LAB — MAGNESIUM: Magnesium: 2.1 mg/dL (ref 1.6–2.3)

## 2021-05-04 LAB — CBC
Hematocrit: 41.2 % (ref 37.5–51.0)
Hemoglobin: 14.2 g/dL (ref 13.0–17.7)
MCH: 29.7 pg (ref 26.6–33.0)
MCHC: 34.5 g/dL (ref 31.5–35.7)
MCV: 86 fL (ref 79–97)
Platelets: 143 10*3/uL — ABNORMAL LOW (ref 150–450)
RBC: 4.78 x10E6/uL (ref 4.14–5.80)
RDW: 11.9 % (ref 11.6–15.4)
WBC: 4.5 10*3/uL (ref 3.4–10.8)

## 2021-05-04 LAB — BASIC METABOLIC PANEL
BUN/Creatinine Ratio: 17 (ref 9–20)
BUN: 17 mg/dL (ref 6–24)
CO2: 27 mmol/L (ref 20–29)
Calcium: 9.2 mg/dL (ref 8.7–10.2)
Chloride: 100 mmol/L (ref 96–106)
Creatinine, Ser: 0.98 mg/dL (ref 0.76–1.27)
Glucose: 81 mg/dL (ref 70–99)
Potassium: 4.3 mmol/L (ref 3.5–5.2)
Sodium: 140 mmol/L (ref 134–144)
eGFR: 89 mL/min/{1.73_m2} (ref >59)

## 2021-05-04 LAB — LIPID PANEL
Chol/HDL Ratio: 4 ratio (ref 0.0–5.0)
Cholesterol, Total: 210 mg/dL — ABNORMAL HIGH (ref 100–199)
HDL: 52 mg/dL (ref >39)
LDL Chol Calc (NIH): 115 mg/dL — ABNORMAL HIGH (ref 0–99)
Triglycerides: 247 mg/dL — ABNORMAL HIGH (ref 0–149)
VLDL Cholesterol Cal: 43 mg/dL — ABNORMAL HIGH (ref 5–40)

## 2021-05-04 LAB — TSH: TSH: 1.7 u[IU]/mL (ref 0.450–4.500)

## 2021-05-04 NOTE — Progress Notes (Unsigned)
Enrolled patient for a 14 day Zio XT  monitor to be mailed to patients home  °

## 2021-05-04 NOTE — Patient Instructions (Signed)
Medication Instructions:  No Changes In Medications at this time.  *If you need a refill on your cardiac medications before your next appointment, please call your pharmacy*  Lab Work: BMET, TSH, LIPID, MAG, CBC- TODAY  If you have labs (blood work) drawn today and your tests are completely normal, you will receive your results only by: [place] (if you have MyChart) OR A paper copy in the mail If you have any lab test that is abnormal or we need to change your treatment, we will call you to review the results.  Testing/Procedures: Your physician has requested that you have an echocardiogram. Echocardiography is a painless test that uses sound waves to create images of your heart. It provides your doctor with information about the size and shape of your heart and how well your hearts chambers and valves are working. You may receive an ultrasound enhancing agent through an IV if needed to better visualize your heart during the echo.This procedure takes approximately one hour. There are no restrictions for this procedure. This will take place at the [address], [address].   Dr. Gardiner Rhyme has ordered a CT coronary calcium score. This test is done at [address]. This is $99 out of pocket.   Coronary CalciumScan A coronary calcium scan is an imaging test used to look for deposits of calcium and other fatty materials (plaques) in the inner lining of the blood vessels of the heart (coronary arteries). These deposits of calcium and plaques can partly clog and narrow the coronary arteries without producing any symptoms or warning signs. This puts a person at risk for a heart attack. This test can detect these deposits before symptoms develop. Tell a health care provider about: Any allergies you have. All medicines you are taking, including vitamins, herbs, eye drops, creams, and over-the-counter medicines. Any problems you or family members have had with anesthetic  medicines. Any blood disorders you have. Any surgeries you have had. Any medical conditions you have. Whether you are pregnant or may be pregnant. What are the risks? Generally, this is a safe procedure. However, problems may occur, including: Harm to a pregnant woman and her unborn baby. This test involves the use of radiation. Radiation exposure can be dangerous to a pregnant woman and her unborn baby. If you are pregnant, you generally should not have this procedure done. Slight increase in the risk of cancer. This is because of the radiation involved in the test. What happens before the procedure? No preparation is needed for this procedure. What happens during the procedure? You will undress and remove any jewelry around your neck or chest. You will put on a hospital gown. Sticky electrodes will be placed on your chest. The electrodes will be connected to an electrocardiogram (ECG) machine to record a tracing of the electrical activity of your heart. A CT scanner will take pictures of your heart. During this time, you will be asked to lie still and hold your breath for 2-3 seconds while a picture of your heart is being taken. The procedure may vary among health care providers and hospitals. What happens after the procedure? You can get dressed. You can return to your normal activities. It is up to you to get the results of your test. Ask your health care provider, or the department that is doing the test, when your results will be ready. Summary A coronary calcium scan is an imaging test used to look for deposits of calcium  and other fatty materials (plaques) in the inner lining of the blood vessels of the heart (coronary arteries). Generally, this is a safe procedure. Tell your health care provider if you are pregnant or may be pregnant. No preparation is needed for this procedure. A CT scanner will take pictures of your heart. You can return to your normal activities after the scan  is done. This information is not intended to replace advice given to you by your health care provider. Make sure you discuss any questions you have with your health care provider. Document Released: 10/28/2007 Document Revised: 03/20/2016 Document Reviewed: 03/20/2016 Elsevier Interactive Patient Education  2017 [place] Term Monitor Instructions   Your physician has requested you wear your ZIO patch monitor 14 days.   This is a single patch monitor.  [employer] supplies one patch monitor per enrollment.  Additional stickers are not available.   Please do not apply patch if you will be having a Nuclear Stress Test, Echocardiogram, Cardiac CT, MRI, or Chest Xray during the time frame you would be wearing the monitor. The patch cannot be worn during these tests.  You cannot remove and re-apply the ZIO XT patch monitor.   Your ZIO patch monitor will be sent USPS Priority mail from [facility] directly to your home address. The monitor may also be mailed to a PO BOX if home delivery is not available.   It may take 3-5 days to receive your monitor after you have been enrolled.   Once you have received you monitor, please review enclosed instructions.  Your monitor has already been registered assigning a specific monitor serial # to you.   Applying the monitor   Shave hair from upper left chest.   Hold abrader disc by orange tab.  Rub abrader in 40 strokes over left upper chest as indicated in your monitor instructions.   Clean area with 4 enclosed alcohol pads .  Use all pads to assure are is cleaned thoroughly.  Let dry.   Apply patch as indicated in monitor instructions.  Patch will be place under collarbone on left side of chest with arrow pointing upward.   Rub patch adhesive wings for 2 minutes.Remove white label marked "1".  Remove white label marked "2".  Rub patch adhesive wings for 2 additional minutes.   While looking in a mirror, press and release button  in center of patch.  A small green light will flash 3-4 times .  This will be your only indicator the monitor has been turned on.     Do not shower for the first 24 hours.  You may shower after the first 24 hours.   Press button if you feel a symptom. You will hear a small click.  Record Date, Time and Symptom in the Patient Log Book.   When you are ready to remove patch, follow instructions on last 2 pages of Patient Log Book.  Stick patch monitor onto last page of Patient Log Book.   Place Patient Log Book in [place] box.  Use locking tab on box and tape box closed securely.  The [employer] has [employer] on it.  Please place in mailbox as soon as possible.  Your physician should have your test results approximately 7 days after the monitor has been mailed back to [facility].   Call [place] at [phone] if you have questions regarding your ZIO XT patch monitor.  Call them immediately if  you see an orange light blinking on your monitor.   If your monitor falls off in less than 4 days contact our Monitor department at [phone].  If your monitor becomes loose or falls off after 4 days call [employer] at [phone] for suggestions on securing your monitor.   Follow-Up: At [facility], you and your health needs are our priority.  As part of our continuing mission to provide you with exceptional heart care, we have created designated Provider Care Teams.  These Care Teams include your primary Cardiologist (physician) and Advanced Practice Providers (APPs -  Physician Assistants and Nurse Practitioners) who all work together to provide you with the care you need, when you need it.  Your next appointment:   6 month(s)  The format for your next appointment:   In Person  Provider:   Donato Heinz, MD

## 2021-05-08 DIAGNOSIS — R002 Palpitations: Secondary | ICD-10-CM | POA: Diagnosis not present

## 2021-05-08 DIAGNOSIS — E785 Hyperlipidemia, unspecified: Secondary | ICD-10-CM

## 2021-05-13 ENCOUNTER — Other Ambulatory Visit: Payer: Self-pay | Admitting: Family Medicine

## 2021-05-13 ENCOUNTER — Other Ambulatory Visit: Payer: Self-pay

## 2021-05-17 ENCOUNTER — Other Ambulatory Visit: Payer: Self-pay | Admitting: Family Medicine

## 2021-05-18 ENCOUNTER — Other Ambulatory Visit: Payer: Self-pay

## 2021-05-23 ENCOUNTER — Encounter: Payer: Self-pay | Admitting: Family Medicine

## 2021-06-09 ENCOUNTER — Encounter: Payer: Self-pay | Admitting: Family Medicine

## 2021-06-09 ENCOUNTER — Other Ambulatory Visit: Payer: Self-pay

## 2021-06-09 ENCOUNTER — Ambulatory Visit (INDEPENDENT_AMBULATORY_CARE_PROVIDER_SITE_OTHER): Payer: 59 | Admitting: Family Medicine

## 2021-06-09 VITALS — BP 126/82 | HR 67 | Temp 97.6°F | Ht 72.0 in | Wt 179.1 lb

## 2021-06-09 DIAGNOSIS — J309 Allergic rhinitis, unspecified: Secondary | ICD-10-CM

## 2021-06-09 DIAGNOSIS — G43009 Migraine without aura, not intractable, without status migrainosus: Secondary | ICD-10-CM

## 2021-06-09 DIAGNOSIS — T7840XA Allergy, unspecified, initial encounter: Secondary | ICD-10-CM | POA: Diagnosis not present

## 2021-06-09 DIAGNOSIS — Z79899 Other long term (current) drug therapy: Secondary | ICD-10-CM | POA: Diagnosis not present

## 2021-06-09 DIAGNOSIS — E782 Mixed hyperlipidemia: Secondary | ICD-10-CM

## 2021-06-09 DIAGNOSIS — R002 Palpitations: Secondary | ICD-10-CM

## 2021-06-09 DIAGNOSIS — K219 Gastro-esophageal reflux disease without esophagitis: Secondary | ICD-10-CM

## 2021-06-09 NOTE — Progress Notes (Signed)
Subjective:     Patient ID: Jonathan Mills, male    DOB: Mar 24, 1961, 61 y.o.   MRN: [id number]  Chief Complaint  Patient presents with   Transfer of Care    Discuss shingles vaccine     HPI TOC  IBS-mixed whole life.  Stopped eating Pecans and did better.  Friend had "blood test" that showed food intolerances-pt would like that done. 2.   Migraine-meds working well.  Gets q o month.  Can last 1 wk, intermitt. Not debilitating.  Wine will trigger. Mg has made HUGE difference 3.    GERD-if not take meds, problem.  EGD 20 yrs ago.   Wants to hold on GI referral for now 4.  Heart palp-worse zio-seeing card-sees next wk. Less caffeine helps.  Sch for echo and ca score 5.  H/o elevated trigs-working on TLC.  Was on meds in past.  Wants to check labs as not fasting for last set  Had shingles <3 yrs ago on scalp on L.  - inquires if needs immunization  Health Maintenance Due  Topic Date Due   Zoster Vaccines- Shingrix (1 of 2) Never done    Past Medical History:  Diagnosis Date   ALLERGIC RHINITIS 10/07/2008   COLONIC POLYPS, HX OF 10/07/2008   GERD 10/07/2008   Headache(784.0)    sinus   HYPERLIPIDEMIA 10/07/2008   Hypertriglyceridemia 07/12/2011   Mood swings    Vitamin D deficiency 07/12/2011    Past Surgical History:  Procedure Laterality Date   EYE SURGERY     LASIK     right ear/neck surgury  05/16/1995   SHOULDER ARTHROSCOPY WITH SUBACROMIAL DECOMPRESSION, ROTATOR CUFF REPAIR AND BICEP TENDON REPAIR Left 12/18/2013   Procedure: LEFT SHOULDER ARTHROSCOPY WITH SUBACROMIAL DECOMPRESSION,DISTAL CLAVICAL RESECTION ROTATOR CUFF REPAIR ;  Surgeon: Marin Shutter, MD;  Location: [place];  Service: Orthopedics;  Laterality: Left;   SHOULDER SURGERY Right 03/04/2021    Outpatient Medications Prior to Visit  Medication Sig Dispense Refill   Azelastine-Fluticasone 137-50 MCG/ACT SUSP Place 1 spray into the nose daily. 23 g 3   cholecalciferol (VITAMIN D3) 25 MCG (1000 UNIT) tablet Take  1,000 Units by mouth daily.     magnesium 30 MG tablet Take 30 mg by mouth 2 (two) times daily.     omeprazole (PRILOSEC) 20 MG capsule TAKE 1 CAPSULE DAILY 90 capsule 0   SUMAtriptan (IMITREX) 100 MG tablet Take 1 tablet earliest onset of migraine.  May repeat once in 2 hours if headache persists or recurs. 10 tablet 5   traMADol (ULTRAM) 50 MG tablet Take 50 mg by mouth every 6 (six) hours as needed.     vardenafil (LEVITRA) 10 MG tablet TAKE 1 TABLET BY MOUTH DAILY AS NEEDED FOR ERECTILE DYSFUNCTION 5 tablet 0   naproxen (NAPROSYN) 500 MG tablet Take 500 mg by mouth 2 (two) times daily.     oxyCODONE-acetaminophen (PERCOCET/ROXICET) 5-325 MG tablet Take 1 tablet by mouth every 4 (four) hours as needed.     Vitamin D, Ergocalciferol, (DRISDOL) 1.25 MG (50000 UNIT) CAPS capsule ONE CAPSULE BY MOUTH ONCE A WEEK FOR 8 WEEKS. THEN TAKE 2000IU/DAY (Patient not taking: Reported on 02/23/2021) 4 capsule 1   No facility-administered medications prior to visit.    Allergies  Allergen Reactions   Penicillins     REACTION: rash   INO:MVEHMCNO/[id number] except as noted in HPI  Doing well w/PT after shoulder surgery.     Objective:  BP 126/82    Pulse 67    Temp 97.6 F (36.4 C) (Temporal)    Ht 6' (1.829 m)    Wt 179 lb 2 oz (81.3 kg)    SpO2 97%    BMI 24.29 kg/m  Wt Readings from Last 3 Encounters:  06/09/21 179 lb 2 oz (81.3 kg)  05/04/21 177 lb 3.2 oz (80.4 kg)  02/23/21 177 lb 12.8 oz (80.6 kg)        Gen: WDWN NAD WM HEENT: NCAT, conjunctiva not injected, sclera nonicteric NECK:  supple, no thyromegaly, no nodes, no carotid bruits CARDIAC: RRR, S1S2+, no murmur. DP 2+B LUNGS: CTAB. No wheezes ABDOMEN:  BS+, soft, NTND, No HSM, no masses EXT:  no edema MSK: no gross abnormalities.  NEURO: A&O x3.  CN II-XII intact.  PSYCH: normal mood. Good eye contact  Assessment & Plan:   Problem List Items Addressed This Visit       Cardiovascular and Mediastinum    Migraines   Relevant Medications   traMADol (ULTRAM) 50 MG tablet     Respiratory   Allergic rhinitis   Relevant Orders   Allergy Panel 18, [employer]     Digestive   GERD     Other   Hyperlipidemia   Relevant Orders   Lipid panel   Heart palpitations   Other Visit Diagnoses     Allergy, initial encounter    -  Primary   Relevant Orders   Allergy Panel 18, [employer]   Encounter for long-term current use of medication       Relevant Orders   Vitamin B12      Allergies/IBS-better off pecans.  Pt requesting allergy panel.  Discussed many different panels.  Will start w/nut panel Migraine-doing well on meds.  Cont GERD-stable on meds.  Cont.  Check B12 d/t long term use of PPI.  Discussed GI-pt wants to wait Palpitations-seeing Card.  W/u in progress.  Better w/dec caffeine but still getting Mixed hyperlipidemia-working on TLC-will return fasting for repeat.  No orders of the defined types were placed in this encounter.   Wellington Hampshire, MD

## 2021-06-09 NOTE — Patient Instructions (Signed)
It was very nice to see you today!  Will send results of nut testing   PLEASE NOTE:  If you had any lab tests please let us know if you have not heard back within a few days. You may see your results on MyChart before we have a chance to review them but we will give you a call once they are reviewed by [place]. If we ordered any referrals today, please let us know if you have not heard from their office within the next week.   Please try these tips to maintain a healthy lifestyle:  Eat most of your calories during the day when you are active. Eliminate processed foods including packaged sweets (pies, cakes, cookies), reduce intake of potatoes, white bread, white pasta, and white [place]. Look for whole grain options, oat flour or almond flour.  Each meal should contain half fruits/vegetables, one quarter protein, and one quarter carbs (no bigger than a computer mouse).  Cut down on sweet beverages. This includes juice, soda, and sweet tea. Also watch fruit intake, though this is a healthier sweet option, it still contains natural sugar! Limit to 3 servings daily.  Drink at least 1 glass of water with each meal and aim for at least 8 glasses per day  Exercise at least 150 minutes every week.

## 2021-06-14 ENCOUNTER — Ambulatory Visit (INDEPENDENT_AMBULATORY_CARE_PROVIDER_SITE_OTHER)
Admission: RE | Admit: 2021-06-14 | Discharge: 2021-06-14 | Disposition: A | Discharge status: Home / Self Care | Payer: Self-pay | Source: Ambulatory Visit | Attending: Cardiology | Admitting: Cardiology

## 2021-06-14 ENCOUNTER — Other Ambulatory Visit (INDEPENDENT_AMBULATORY_CARE_PROVIDER_SITE_OTHER): Payer: 59

## 2021-06-14 ENCOUNTER — Ambulatory Visit (HOSPITAL_COMMUNITY): Payer: 59 | Attending: Cardiology

## 2021-06-14 ENCOUNTER — Other Ambulatory Visit: Payer: Self-pay

## 2021-06-14 DIAGNOSIS — R002 Palpitations: Secondary | ICD-10-CM | POA: Insufficient documentation

## 2021-06-14 DIAGNOSIS — E782 Mixed hyperlipidemia: Secondary | ICD-10-CM | POA: Diagnosis not present

## 2021-06-14 DIAGNOSIS — T7840XA Allergy, unspecified, initial encounter: Secondary | ICD-10-CM

## 2021-06-14 DIAGNOSIS — Z79899 Other long term (current) drug therapy: Secondary | ICD-10-CM

## 2021-06-14 DIAGNOSIS — E785 Hyperlipidemia, unspecified: Secondary | ICD-10-CM | POA: Insufficient documentation

## 2021-06-14 DIAGNOSIS — J309 Allergic rhinitis, unspecified: Secondary | ICD-10-CM

## 2021-06-14 LAB — LIPID PANEL
Cholesterol: 227 mg/dL — ABNORMAL HIGH (ref 0–200)
HDL: 54.5 mg/dL (ref >39.00)
NonHDL: 172.89
Total CHOL/HDL Ratio: 4
Triglycerides: 238 mg/dL — ABNORMAL HIGH (ref 0.0–149.0)
VLDL: 47.6 mg/dL — ABNORMAL HIGH (ref 0.0–40.0)

## 2021-06-14 LAB — ECHOCARDIOGRAM COMPLETE
Area-P 1/2: 2.56 cm2
S' Lateral: 2.7 cm

## 2021-06-14 LAB — VITAMIN B12: Vitamin B-12: 320 pg/mL (ref 211–911)

## 2021-06-14 LAB — LDL CHOLESTEROL, DIRECT: Direct LDL: 134 mg/dL

## 2021-06-15 ENCOUNTER — Other Ambulatory Visit: Payer: 59

## 2021-06-15 ENCOUNTER — Other Ambulatory Visit: Payer: Self-pay | Admitting: *Deleted

## 2021-06-15 LAB — ALLERGY PANEL 18, NUT MIX GROUP
Almonds: <0.1 kU/L
CLASS: 0
CLASS: 0
CLASS: 0
CLASS: 0
Cashew IgE: <0.1 kU/L
Coconut: <0.1 kU/L
Hazelnut: 0.31 kU/L — ABNORMAL HIGH
Peanut IgE: 0.15 kU/L — ABNORMAL HIGH
Pecan Nut: <0.1 kU/L
Sesame Seed f10: 0.19 kU/L — ABNORMAL HIGH

## 2021-06-15 LAB — INTERPRETATION:

## 2021-06-16 ENCOUNTER — Encounter: Payer: Self-pay | Admitting: *Deleted

## 2021-06-16 ENCOUNTER — Other Ambulatory Visit: Payer: Self-pay | Admitting: Family Medicine

## 2021-06-16 DIAGNOSIS — E782 Mixed hyperlipidemia: Secondary | ICD-10-CM

## 2021-06-16 MED ORDER — ROSUVASTATIN CALCIUM 10 MG PO TABS: 10.0000 mg | ORAL_TABLET | Freq: Every day | ORAL | 1 refills | Status: DC

## 2021-06-20 ENCOUNTER — Encounter: Payer: Self-pay | Admitting: Cardiology

## 2021-06-21 NOTE — Telephone Encounter (Signed)
Good morning, Statins won't reduce the calcium that is already present. Statins help to lower cholesterol in your blood to normal levels, which help to prevent further plaque build-up in your blood vessels. Contact us with any further questions/concerns, Judson Roch

## 2021-08-11 ENCOUNTER — Other Ambulatory Visit: Payer: Self-pay | Admitting: Family Medicine

## 2021-08-27 ENCOUNTER — Other Ambulatory Visit: Payer: Self-pay | Admitting: Family Medicine

## 2021-10-18 ENCOUNTER — Other Ambulatory Visit: Payer: 59

## 2021-10-19 ENCOUNTER — Other Ambulatory Visit: Payer: 59

## 2021-10-20 ENCOUNTER — Other Ambulatory Visit (INDEPENDENT_AMBULATORY_CARE_PROVIDER_SITE_OTHER): Payer: 59

## 2021-10-20 DIAGNOSIS — E782 Mixed hyperlipidemia: Secondary | ICD-10-CM | POA: Diagnosis not present

## 2021-10-20 LAB — COMPREHENSIVE METABOLIC PANEL
ALT: 27 U/L (ref 0–53)
AST: 23 U/L (ref 0–37)
Albumin: 4.4 g/dL (ref 3.5–5.2)
Alkaline Phosphatase: 53 U/L (ref 39–117)
BUN: 18 mg/dL (ref 6–23)
CO2: 26 mEq/L (ref 19–32)
Calcium: 9.2 mg/dL (ref 8.4–10.5)
Chloride: 105 mEq/L (ref 96–112)
Creatinine, Ser: 0.98 mg/dL (ref 0.40–1.50)
GFR: 84.22 mL/min (ref >60.00)
Glucose, Bld: 95 mg/dL (ref 70–99)
Potassium: 3.9 mEq/L (ref 3.5–5.1)
Sodium: 139 mEq/L (ref 135–145)
Total Bilirubin: 0.4 mg/dL (ref 0.2–1.2)
Total Protein: 7.1 g/dL (ref 6.0–8.3)

## 2021-10-20 LAB — LDL CHOLESTEROL, DIRECT: Direct LDL: 56 mg/dL

## 2021-10-20 LAB — LIPID PANEL
Cholesterol: 126 mg/dL (ref 0–200)
HDL: 48.4 mg/dL (ref >39.00)
NonHDL: 77.29
Total CHOL/HDL Ratio: 3
Triglycerides: 307 mg/dL — ABNORMAL HIGH (ref 0.0–149.0)
VLDL: 61.4 mg/dL — ABNORMAL HIGH (ref 0.0–40.0)

## 2021-11-07 ENCOUNTER — Ambulatory Visit: Payer: 59 | Admitting: Cardiology

## 2021-12-05 ENCOUNTER — Encounter: Payer: 59 | Admitting: Family Medicine

## 2021-12-19 ENCOUNTER — Ambulatory Visit (INDEPENDENT_AMBULATORY_CARE_PROVIDER_SITE_OTHER): Payer: 59 | Admitting: Family Medicine

## 2021-12-19 ENCOUNTER — Encounter: Payer: Self-pay | Admitting: Family Medicine

## 2021-12-19 VITALS — BP 118/62 | HR 64 | Temp 98.1°F | Ht 72.0 in | Wt 186.1 lb

## 2021-12-19 DIAGNOSIS — E782 Mixed hyperlipidemia: Secondary | ICD-10-CM

## 2021-12-19 DIAGNOSIS — Z0000 Encounter for general adult medical examination without abnormal findings: Secondary | ICD-10-CM

## 2021-12-19 MED ORDER — VARDENAFIL HCL 20 MG PO TABS: 20.0000 mg | ORAL_TABLET | Freq: Every day | ORAL | 3 refills | Status: DC | PRN

## 2021-12-19 MED ORDER — ROSUVASTATIN CALCIUM 10 MG PO TABS
10.0000 mg | ORAL_TABLET | Freq: Every day | ORAL | 1 refills | Status: DC
Start: 2021-12-19 — End: 2022-06-06

## 2021-12-19 NOTE — Patient Instructions (Signed)
It was very nice to see you today!  [place] natural fish oil.    PLEASE NOTE:  If you had any lab tests please let us know if you have not heard back within a few days. You may see your results on MyChart before we have a chance to review them but we will give you a call once they are reviewed by [place]. If we ordered any referrals today, please let us know if you have not heard from their office within the next week.   Please try these tips to maintain a healthy lifestyle:  Eat most of your calories during the day when you are active. Eliminate processed foods including packaged sweets (pies, cakes, cookies), reduce intake of potatoes, white bread, white pasta, and white [place]. Look for whole grain options, oat flour or almond flour.  Each meal should contain half fruits/vegetables, one quarter protein, and one quarter carbs (no bigger than a computer mouse).  Cut down on sweet beverages. This includes juice, soda, and sweet tea. Also watch fruit intake, though this is a healthier sweet option, it still contains natural sugar! Limit to 3 servings daily.  Drink at least 1 glass of water with each meal and aim for at least 8 glasses per day  Exercise at least 150 minutes every week.

## 2021-12-19 NOTE — Progress Notes (Signed)
Phone: [phone]   Subjective:  Patient 61 y.o. male presenting for annual physical.  Chief Complaint  Patient presents with   Annual Exam    Not fasting    Annual-exercising.  Working on diet/exercise.   Had "minor" case of shingles on L scalp sev yrs ago. .  ED-'10mg'$  levitra not working as well did '5mg'$  and then 20 and the 20's work.   Mixed HLD-on rosuvastatin.  Was on fenofibrate for years but off and working on diet/exercise.  Chronic sl low plts.  See problem oriented charting- ROS- ROS: Gen: no fever, chills  Skin: no rash, itching ENT: no ear pain, ear drainage, nasal congestion, rhinorrhea, sinus pressure, sore throat Eyes: no blurry vision, double vision.  Some floater Resp: no cough, wheeze,SOB CV: no CP, palpitations, LE edema,  GI: no heartburn, n/v/d/c, abd pain.   Feeling much better off all nuts.  2-3x/yr, bm and blood.  Saw GI 10 yrs ago and did scope and told annual "lesion" tear.   No pain. GU: no dysuria, urgency, frequency, hematuria MSK: chronic Neuro: no dizziness, weakness, vertigo,  occ ha -last 58moago after stopped nuts.  Psych: no depression, anxiety, insomnia, SI   The following were reviewed and entered/updated in epic: Past Medical History:  Diagnosis Date   ALLERGIC RHINITIS 10/07/2008   COLONIC POLYPS, HX OF 10/07/2008   GERD 10/07/2008   Headache(784.0)    sinus   HYPERLIPIDEMIA 10/07/2008   Hypertriglyceridemia 07/12/2011   Mood swings    Vitamin D deficiency 07/12/2011   Patient Active Problem List   Diagnosis Date Noted   Heart palpitations 02/23/2021   Acute pain of right foot 02/23/2021   Migraines 02/05/2019   Memory difficulty 10/28/2014   Vitamin D deficiency 07/12/2011   Hyperlipidemia 10/07/2008   Allergic rhinitis 10/07/2008   GERD 10/07/2008   COLONIC POLYPS, HX OF 10/07/2008   Past Surgical History:  Procedure Laterality Date   EYE SURGERY     LASIK     right ear/neck surgury  05/16/1995   SHOULDER ARTHROSCOPY WITH  SUBACROMIAL DECOMPRESSION, ROTATOR CUFF REPAIR AND BICEP TENDON REPAIR Left 12/18/2013   Procedure: LEFT SHOULDER ARTHROSCOPY WITH SUBACROMIAL DECOMPRESSION,DISTAL CLAVICAL RESECTION ROTATOR CUFF REPAIR ;  Surgeon: KMarin Shutter MD;  [place]  Service: Orthopedics;  Laterality: Left;   SHOULDER SURGERY Right 03/04/2021    Family History  Problem Relation Age of Onset   Cancer Father     Medications- reviewed and updated Current Outpatient Medications  Medication Sig Dispense Refill   Azelastine-Fluticasone 137-50 MCG/ACT SUSP Place 1 spray into the nose daily. 23 g 3   cholecalciferol (VITAMIN D3) 25 MCG (1000 UNIT) tablet Take 1,000 Units by mouth daily.     magnesium 30 MG tablet Take 30 mg by mouth 2 (two) times daily.     omeprazole (PRILOSEC) 20 MG capsule TAKE 1 CAPSULE DAILY 90 capsule 3   SUMAtriptan (IMITREX) 100 MG tablet Take 1 tablet earliest onset of migraine.  May repeat once in 2 hours if headache persists or recurs. 10 tablet 5   vardenafil (LEVITRA) 20 MG tablet Take 1 tablet (20 mg total) by mouth daily as needed for erectile dysfunction. 30 tablet 3   rosuvastatin (CRESTOR) 10 MG tablet Take 1 tablet (10 mg total) by mouth daily. 90 tablet 1   No current facility-administered medications for this visit.    Allergies-reviewed and updated Allergies  Allergen Reactions   Penicillins     REACTION: rash  Social History   Social History Narrative   Lives with daughter in 2 story home   Master on 1st floor      Semi-retired.   Assembly tech   Objective  Objective:  BP 118/62   Pulse 64   Temp 98.1 F (36.7 C) (Temporal)   Ht 6' (1.829 m)   Wt 186 lb 2 oz (84.4 kg)   SpO2 95%   BMI 25.24 kg/m  Physical Exam  Gen: WDWN NAD HEENT: NCAT, conjunctiva not injected, sclera nonicteric TM WNL B, OP moist, no exudates  NECK:  supple, no thyromegaly, no nodes, no carotid bruits CARDIAC: RRR, S1S2+, no murmur. DP 2+B LUNGS: CTAB. No  wheezes ABDOMEN:  BS+, soft, NTND, No HSM, no masses EXT:  no edema MSK: no gross abnormalities. MS 5/5 all 4 NEURO: A&O x3.  CN II-XII intact.  PSYCH: normal mood. Good eye contact     Assessment and Plan   Health Maintenance counseling: 1. Anticipatory guidance: Patient counseled regarding regular dental exams q6 months, eye exams yearly, avoiding smoking and second hand smoke, limiting alcohol to 2 beverages per day.   2. Risk factor reduction:  Advised patient of need for regular exercise and diet rich in fruits and vegetables to reduce risk of heart attack and stroke. Exercise- +.   Wt Readings from Last 3 Encounters:  12/19/21 186 lb 2 oz (84.4 kg)  06/09/21 179 lb 2 oz (81.3 kg)  05/04/21 177 lb 3.2 oz (80.4 kg)   3. Immunizations/screenings/ancillary studies Immunization History  Administered Date(s) Administered   Influenza Whole 03/09/2009   Influenza, Quadrivalent, Recombinant, Inj, Pf 04/06/2018, 03/03/2019   Influenza,inj,Quad PF,6+ Mos 02/26/2015, 03/03/2017, 03/16/2020   Influenza,inj,quad, With Preservative 02/13/2020   Janssen (J&J) SARS-COV-2 Vaccination 08/25/2019   Moderna Covid-19 Vaccine Bivalent Booster 66yr & up 03/28/2021   Moderna SARS-COV2 Booster Vaccination 04/23/2020, 10/28/2020   Tdap 10/28/2014   There are no preventive care reminders to display for this patient.   4. Prostate cancer screening >55yo - risk factors?  Lab Results  Component Value Date   PSA 0.5 01/08/2020   PSA 0.43 02/14/2019   PSA 0.43 10/23/2017    5. Colon cancer screening:utd 6. Skin cancer screening- Iadvised regular sunscreen use. Denies worrisome, changing, or new skin lesions.  7. Smoking associated screening (lung cancer screening, AAA screen 65-75, UA)- non smoker-    Problem List Items Addressed This Visit       Other   Hyperlipidemia   Relevant Medications   rosuvastatin (CRESTOR) 10 MG tablet   vardenafil (LEVITRA) 20 MG tablet   Other Relevant Orders    Lipid panel   Comprehensive metabolic panel   Other Visit Diagnoses     Wellness examination    -  Primary      Wellness-antic guidance.  Labs discussed.  F/u 1 yr.  Wants to hold on shingrix for now Mixed HLD-chronic.  LDL controlled on crestor '20mg'$ .  Cont.  Trigs still elevated despite diet/exercise.  On fenofibrated in past.  Check lipids/cmp in 4 mo.  If still elevated, will add otc fish oil  Recommended follow up: annualReturn in about 1 year (around 12/20/2022) for annual 1 yr.    lab only 4 months. No future appointments.   Lab/Order associations:return 4 mo fasting   ICD-10-CM   1. Wellness examination  Z00.00     2. Mixed hyperlipidemia  E78.2 Lipid panel    Comprehensive metabolic panel      Meds ordered  this encounter  Medications   rosuvastatin (CRESTOR) 10 MG tablet    Sig: Take 1 tablet (10 mg total) by mouth daily.    Dispense:  90 tablet    Refill:  1   vardenafil (LEVITRA) 20 MG tablet    Sig: Take 1 tablet (20 mg total) by mouth daily as needed for erectile dysfunction.    Dispense:  30 tablet    Refill:  3     Wellington Hampshire, MD

## 2022-01-16 ENCOUNTER — Other Ambulatory Visit: Payer: Self-pay | Admitting: Neurology

## 2022-04-27 ENCOUNTER — Encounter: Payer: Self-pay | Admitting: *Deleted

## 2022-06-06 ENCOUNTER — Other Ambulatory Visit: Payer: Self-pay | Admitting: Family Medicine

## 2022-08-07 ENCOUNTER — Other Ambulatory Visit: Payer: Self-pay | Admitting: Family Medicine

## 2022-11-30 ENCOUNTER — Encounter: Payer: Self-pay | Admitting: Internal Medicine

## 2022-11-30 ENCOUNTER — Ambulatory Visit (INDEPENDENT_AMBULATORY_CARE_PROVIDER_SITE_OTHER): Payer: 59 | Admitting: Internal Medicine

## 2022-11-30 VITALS — BP 108/78 | HR 63 | Temp 98.1°F | Ht 72.0 in | Wt 178.6 lb

## 2022-11-30 DIAGNOSIS — M549 Dorsalgia, unspecified: Secondary | ICD-10-CM

## 2022-11-30 MED ORDER — CAPSAICIN 0.025 % EX PTCH
1.0000 | MEDICATED_PATCH | Freq: Every day | CUTANEOUS | 1 refills | Status: DC
Start: 2022-11-30 — End: 2022-12-25

## 2022-11-30 MED ORDER — KETOROLAC TROMETHAMINE 10 MG PO TABS: 10.0000 mg | ORAL_TABLET | Freq: Four times a day (QID) | ORAL | 0 refills | Status: DC | PRN

## 2022-11-30 MED ORDER — METHYLPREDNISOLONE 4 MG PO TBPK
ORAL_TABLET | ORAL | 0 refills | Status: DC
Start: 2022-11-30 — End: 2022-12-25

## 2022-11-30 MED ORDER — CYCLOBENZAPRINE HCL 10 MG PO TABS
10.0000 mg | ORAL_TABLET | Freq: Three times a day (TID) | ORAL | 0 refills | Status: DC | PRN
Start: 2022-11-30 — End: 2023-12-27

## 2022-11-30 MED ORDER — TRAMADOL HCL 50 MG PO TABS
50.0000 mg | ORAL_TABLET | Freq: Three times a day (TID) | ORAL | 0 refills | Status: AC | PRN
Start: 2022-11-30 — End: 2022-12-05

## 2022-11-30 NOTE — Assessment & Plan Note (Addendum)
Acute Lower Back Pain: They have a history of chronic lower back pain with a recent acute exacerbation following physical exertion, likely due to a herniated disc with nerve impingement causing muscle spasms and pain. There are no red flags for serious conditions. We will order a lumbar spine x-ray to rule out bone tumor and alignment problems, though this is optional. For severe pain, we will prescribe Tramadol, cautioning against alcohol use. Toradol will be prescribed for moderate pain, noting the potential for heartburn. To reduce inflammation, a Medrol Dosepak is prescribed. For muscle relaxation, Flexeril will be used, acknowledging its sedating effects. Capsaicin (Salonpas hot) is prescribed for topical pain relief. We recommend an over-the-counter TENS unit for additional pain relief and suggest gentle stretching exercises and maintaining activity to prevent stiffness and promote healing. A chiropractic visit is considered advisable before any travel.  Chronic Neck Pain: They have a history of neck pain, managed with regular chiropractic adjustments, with no acute issues currently. Regular chiropractic care will continue. The use of a TENS unit for additional pain relief is also considered if needed.     ---------------------  Given this patient's history and physical exam, the most likely cause for his back pain is degenerative osteoarthritis and degenerative disk disease of his vertebral column.  Specifically I suspect(s) that he bulged or herniated a degenerated disk while getting off the toilet.  I have considered and concluded the patient has a very low likelihood of having one of the following serious causes of back pain: bone tumor, acute bone fracture, aortic aneurysm, vertebral disk infection, or pyelonephritis.  He also reports he has no evidence concerning for acute spinal compression (such as extremity weakness, loss of bowel or bladder function, groin numbness). he was warned to go to ER  for urgent re-evaluation if concerning symptoms develop.  Red Flag symptom(s) were reviewed in detail.  For acute pain, rest, intermittent application of cold packs (later, may switch to heat, but do not sleep on heating pad), analgesics and muscle relaxants are recommended.  For more chronic pain, take as needed NSAID's (with plenty of fluids)  Proper lifting with avoidance of heavy lifting discussed.  Gave him handout (AVS) for how to do stretches and discussed using it for a home back care exercise program with flexion exercise routine.  Physical Therapy and Xray offered and/or ordered.

## 2022-11-30 NOTE — Patient Instructions (Addendum)
VISIT SUMMARY:  During your visit, we discussed your long-standing lower back pain that has recently worsened, and your chronic neck pain. You've been managing your back pain with chiropractic adjustments, icing, and over-the-counter ibuprofen. You're concerned about an upcoming trip that may exacerbate your back pain. We've identified no serious conditions related to your pain.  YOUR PLAN:  -ACUTE LOWER BACK PAIN: Your recent increase in back pain is likely due to a herniated disc pressing on a nerve, causing muscle spasms and pain. We're prescribing several medications to manage your pain and reduce inflammation. We also recommend gentle stretching exercises, maintaining activity to prevent stiffness, and a chiropractic visit before your trip.  -CHRONIC NECK PAIN: Your neck pain is being managed with regular chiropractic adjustments. We recommend continuing this treatment and considering a TENS unit for additional pain relief if needed.  INSTRUCTIONS:  We're ordering a lumbar spine x-ray to check for any bone or alignment issues, but this is optional. Please take your prescribed medications as directed, and remember to avoid alcohol while taking Tramadol. Be aware that Toradol may cause heartburn, and Flexeril may make you drowsy. Use the Capsaicin for topical pain relief, and consider an over-the-counter TENS unit for additional relief. Continue with gentle stretching exercises and maintain activity to prevent stiffness. Schedule a chiropractic visit before your trip.      TENS UNIT: This is helpful for muscle pain and spasm.   Search and Purchase a TENS 7000 2nd edition at  www.tenspros.com or www.Amazon.com It should be less than $30.     TENS unit instructions: Do not shower or bathe with the unit on Turn the unit off before removing electrodes or batteries If the electrodes lose stickiness add a drop of water to the electrodes after they are disconnected from the unit and place on  plastic sheet. If you continued to have difficulty, call the TENS unit company to purchase more electrodes. Do not apply lotion on the skin area prior to use. Make sure the skin is clean and dry as this will help prolong the life of the electrodes. After use, always check skin for unusual red areas, rash or other skin difficulties. If there are any skin problems, does not apply electrodes to the same area. Never remove the electrodes from the unit by pulling the wires. Do not use the TENS unit or electrodes other than as directed. Do not change electrode placement without consultating your therapist or physician. Keep 2 fingers with between each electrode. Wear time ratio is 2:1, on to off times.    For example on for 30 minutes off for 15 minutes and then on for 30 minutes off for 15 minutes       It was a pleasure seeing you today! Your health and satisfaction are our top priorities.  Glenetta Hew, MD  Your Providers PCP: Jeani Sow, MD,  [phone]) Referring Provider: Jeani Sow, MD,  [phone]) Care Team Provider: Little Ishikawa, MD,  [phone])   NEXT STEPS: [x]  Early Intervention: Schedule sooner appointment, call our on-call services, or go to emergency room if there is any significant Increase in pain or discomfort New or worsening symptoms Sudden or severe changes in your health [x]  Flexible Follow-Up: We recommend a 1 week follow up with Dr. Ruthine Dose for optimal care. This allows for progress monitoring and treatment adjustments. [x]  Preventive Care: Schedule your annual preventive care visit! It's typically covered by insurance and helps identify potential health issues early. [x]  Lab &  X-ray Appointments: Incomplete tests scheduled today, or call to schedule. X-rays: [facility] (M-F, 8:30am-noon or 1pm-5pm). [x]  Medical Information Release: Sign a release form at front desk to obtain relevant medical information we don't  have.  MAKING THE MOST OF OUR FOCUSED 20 MINUTE APPOINTMENTS: [x]   Clearly state your top concerns at the beginning of the visit to focus our discussion [x]   If you anticipate you will need more time, please inform the front desk during scheduling - we can book multiple appointments in the same week. [x]   If you have transportation problems- use our convenient video appointments or ask about transportation support. [x]   We can get down to business faster if you use MyChart to update information before the visit and submit non-urgent questions before your visit. Thank you for taking the time to provide details through MyChart.  Let our nurse know and she can import this information into your encounter documents.  Arrival and Wait Times: [x]   Arriving on time ensures that everyone receives prompt attention. [x]   Early morning (8a) and afternoon (1p) appointments tend to have shortest wait times. [x]   Unfortunately, we cannot delay appointments for late arrivals or hold slots during phone calls.  Getting Answers and Following Up [x]   Simple Questions & Concerns: For quick questions or basic follow-up after your visit, reach [place] at [phone] or MyChart messaging. [x]   Complex Concerns: If your concern is more complex, scheduling an appointment might be best. Discuss this with the staff to find the most suitable option. [x]   Lab & Imaging Results: We'll contact you directly if results are abnormal or you don't use MyChart. Most normal results will be on MyChart within 2-3 business days, with a review message from Dr. Jon Billings. Haven't heard back in 2 weeks? Need results sooner? Contact us at [phone]. [x]   Referrals: Our referral coordinator will manage specialist referrals. The specialist's office should contact you within 2 weeks to schedule an appointment. Call us if you haven't heard from them after 2 weeks.  Staying Connected [x]   MyChart: Activate your MyChart for the fastest way to  access results and message [place]. See the last page of this paperwork for instructions on how to activate.  Bring to Your Next Appointment [x]   Medications: Please bring all your medication bottles to your next appointment to ensure we have an accurate record of your prescriptions. [x]   Health Diaries: If you're monitoring any health conditions at home, keeping a diary of your readings can be very helpful for discussions at your next appointment.  Billing [x]   X-ray & Lab Orders: These are billed by separate companies. Contact the invoicing company directly for questions or concerns. [x]   Visit Charges: Discuss any billing inquiries with our administrative services team.  Your Satisfaction Matters [x]   Share Your Experience: We strive for your satisfaction! If you have any complaints, or preferably compliments, please let Dr. Jon Billings know directly or contact our Practice Administrators, Edwena Felty or [employer], by asking at the front desk.   Reviewing Your Records [x]   Review this early draft of your clinical encounter notes below and the final encounter summary tomorrow on MyChart after its been completed.  All orders placed so far are visible here: Back pain, unspecified back location, unspecified back pain laterality, unspecified chronicity Assessment & Plan: Acute Lower Back Pain: They have a history of chronic lower back pain with a recent acute exacerbation following physical exertion, likely due to a herniated disc  with nerve impingement causing muscle spasms and pain. There are no red flags for serious conditions. We will order a lumbar spine x-ray to rule out bone tumor and alignment problems, though this is optional. For severe pain, we will prescribe Tramadol, cautioning against alcohol use. Toradol will be prescribed for moderate pain, noting the potential for heartburn. To reduce inflammation, a Medrol Dosepak is prescribed. For muscle relaxation, Flexeril will be used,  acknowledging its sedating effects. Capsaicin (Salonpas hot) is prescribed for topical pain relief. We recommend an over-the-counter TENS unit for additional pain relief and suggest gentle stretching exercises and maintaining activity to prevent stiffness and promote healing. A chiropractic visit is considered advisable before any travel.  Chronic Neck Pain: They have a history of neck pain, managed with regular chiropractic adjustments, with no acute issues currently. Regular chiropractic care will continue. The use of a TENS unit for additional pain relief is also considered if needed.     ---------------------  Given this patient's history and physical exam, the most likely cause for his back pain is degenerative osteoarthritis and degenerative disk disease of his vertebral column.  Specifically I suspect(s) that he bulged or herniated a degenerated disk while getting off the toilet.  I have considered and concluded the patient has a very low likelihood of having one of the following serious causes of back pain: bone tumor, acute bone fracture, aortic aneurysm, vertebral disk infection, or pyelonephritis.  He also reports he has no evidence concerning for acute spinal compression (such as extremity weakness, loss of bowel or bladder function, groin numbness). he was warned to go to ER for urgent re-evaluation if concerning symptoms develop.  Red Flag symptom(s) were reviewed in detail.  For acute pain, rest, intermittent application of cold packs (later, may switch to heat, but do not sleep on heating pad), analgesics and muscle relaxants are recommended.  For more chronic pain, take as needed NSAID's (with plenty of fluids)  Proper lifting with avoidance of heavy lifting discussed.  Gave him handout (AVS) for how to do stretches and discussed using it for a home back care exercise program with flexion exercise routine.  Physical Therapy and Xray offered and/or ordered.  Orders: -      methylPREDNISolone; Use as directed.  Dispense: 21 each; Refill: 0 -     DG Lumbar Spine 2-3 Views; Future -     Ketorolac Tromethamine; Take 1 tablet (10 mg total) by mouth every 6 (six) hours as needed.  Dispense: 20 tablet; Refill: 0 -     traMADol HCl; Take 1 tablet (50 mg total) by mouth every 8 (eight) hours as needed for up to 5 days.  Dispense: 15 tablet; Refill: 0 -     Cyclobenzaprine HCl; Take 1 tablet (10 mg total) by mouth 3 (three) times daily as needed for muscle spasms.  Dispense: 30 tablet; Refill: 0 -     Capsaicin; Apply 1 patch topically daily at 6 (six) AM.  Dispense: 10 patch; Refill: 1
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
## 2022-12-25 ENCOUNTER — Encounter: Payer: Self-pay | Admitting: Family Medicine

## 2022-12-25 ENCOUNTER — Ambulatory Visit (INDEPENDENT_AMBULATORY_CARE_PROVIDER_SITE_OTHER): Payer: 59 | Admitting: Family Medicine

## 2022-12-25 VITALS — BP 112/78 | HR 75 | Temp 98.0°F | Ht 72.0 in | Wt 178.2 lb

## 2022-12-25 DIAGNOSIS — K219 Gastro-esophageal reflux disease without esophagitis: Secondary | ICD-10-CM | POA: Diagnosis not present

## 2022-12-25 DIAGNOSIS — Z125 Encounter for screening for malignant neoplasm of prostate: Secondary | ICD-10-CM

## 2022-12-25 DIAGNOSIS — G43009 Migraine without aura, not intractable, without status migrainosus: Secondary | ICD-10-CM

## 2022-12-25 DIAGNOSIS — Z0000 Encounter for general adult medical examination without abnormal findings: Secondary | ICD-10-CM | POA: Diagnosis not present

## 2022-12-25 DIAGNOSIS — E782 Mixed hyperlipidemia: Secondary | ICD-10-CM

## 2022-12-25 DIAGNOSIS — R002 Palpitations: Secondary | ICD-10-CM

## 2022-12-25 MED ORDER — VARDENAFIL HCL 20 MG PO TABS: 20.0000 mg | ORAL_TABLET | Freq: Every day | ORAL | 3 refills | Status: DC | PRN

## 2022-12-25 MED ORDER — OMEPRAZOLE 20 MG PO CPDR: 20.0000 mg | DELAYED_RELEASE_CAPSULE | Freq: Every day | ORAL | 3 refills | Status: DC

## 2022-12-25 NOTE — Assessment & Plan Note (Signed)
Chronic.  Much less frequent since stopped eating nuts.  Sumatriptan 100 mg works if he needs it.  Does not need a refill at this time

## 2022-12-25 NOTE — Assessment & Plan Note (Signed)
Chronic.  Much better controlled since decreased caffeine

## 2022-12-25 NOTE — Assessment & Plan Note (Signed)
Chronic.  Controlled on omeprazole 20 mg daily.  Cannot get off of it

## 2022-12-25 NOTE — Progress Notes (Signed)
Phone: [phone]   Subjective:  Patient 62 y.o. male presenting for annual physical.  Chief Complaint  Patient presents with   Annual Exam   Annual - regularly goes to the gym.   GERD - Takes omeprazole 20 mg daily, which has helped a lot. He reports episodes are always worse when he has forgotten to take his medication.   Migraines - He reports he recently has not had migraines as frequently as before. He can go months without needing to take Sumatriptan. Since he stopped eating nuts, due to his nut intolerances, he has noticed a decreased frequency.   Hypertriglyceridemia - Taking rosuvastatin 10 mg. Latest labs show trig in 300s. He states his trig has been as high as 1000s before.   Palpitations - He endorses episodes of palpitations where his heart beats harder. Denies any feeling of irregular rate or rhythm, just beating harder. He has cut his caffeine intake, which has significantly reduced these episodes.   ED - He has been taking Levitra 20 mg.   Chronic lower back pain  - he has been taking toradol 10 mg to manage his chronic lower back pain.   Anal fissure - He believes he may have an anal fissure. No straining, diarrhea with bowel movements. Just some BRB in toilet after BM at times.  Last cscope about 59yrs-[facility]  See problem oriented charting- ROS- ROS: Gen: no fever, chills  Skin: no rash, itching ENT: no ear pain, ear drainage, nasal congestion, rhinorrhea, sinus pressure, sore throat Eyes: no blurry vision, double vision Resp: no cough, wheeze,SOB CV: no CP, LE edema,  GI: No n/v/d/c, abd pain WG:NFAO urinary complaints-can have decreased stream,  occ frequency.  Nothing to take meds for yet.  MSK: no joint pain, myalgias,  Neuro: no dizziness, weakness, vertigo Psych: no depression, anxiety, insomnia, SI   The following were reviewed and entered/updated in epic: Past Medical History:  Diagnosis Date   ALLERGIC RHINITIS 10/07/2008   COLONIC POLYPS, HX  OF 10/07/2008   GERD 10/07/2008   Headache(784.0)    sinus   HYPERLIPIDEMIA 10/07/2008   Hypertriglyceridemia 07/12/2011   Mood swings    Vitamin D deficiency 07/12/2011   Patient Active Problem List   Diagnosis Date Noted   Heart palpitations 02/23/2021   Acute pain of right foot 02/23/2021   Migraines 02/05/2019   Back pain 10/28/2015   Memory difficulty 10/28/2014   Vitamin D deficiency 07/12/2011   Hyperlipidemia 10/07/2008   Allergic rhinitis 10/07/2008   GERD 10/07/2008   COLONIC POLYPS, HX OF 10/07/2008   Past Surgical History:  Procedure Laterality Date   EYE SURGERY     LASIK     right ear/neck surgury  05/16/1995   SHOULDER ARTHROSCOPY WITH SUBACROMIAL DECOMPRESSION, ROTATOR CUFF REPAIR AND BICEP TENDON REPAIR Left 12/18/2013   Procedure: LEFT SHOULDER ARTHROSCOPY WITH SUBACROMIAL DECOMPRESSION,DISTAL CLAVICAL RESECTION ROTATOR CUFF REPAIR ;  Surgeon: Senaida Lange, MD;  Location: [facility] OR;  Service: Orthopedics;  Laterality: Left;   SHOULDER SURGERY Right 03/04/2021    Family History  Problem Relation Age of Onset   Cancer Father 68       brain    Medications- reviewed and updated Current Outpatient Medications  Medication Sig Dispense Refill   Azelastine-Fluticasone 137-50 MCG/ACT SUSP Place 1 spray into the nose daily. 23 g 3   cholecalciferol (VITAMIN D3) 25 MCG (1000 UNIT) tablet Take 1,000 Units by mouth daily.     cyclobenzaprine (FLEXERIL) 10 MG tablet Take 1 tablet (  10 mg total) by mouth 3 (three) times daily as needed for muscle spasms. 30 tablet 0   ketorolac (TORADOL) 10 MG tablet Take 1 tablet (10 mg total) by mouth every 6 (six) hours as needed. 20 tablet 0   rosuvastatin (CRESTOR) 10 MG tablet TAKE 1 TABLET DAILY 90 tablet 3   SUMAtriptan (IMITREX) 100 MG tablet Take 1 tablet earliest onset of migraine.  May repeat once in 2 hours if headache persists or recurs. 10 tablet 5   omeprazole (PRILOSEC) 20 MG capsule Take 1 capsule (20 mg total) by mouth  daily. 90 capsule 3   vardenafil (LEVITRA) 20 MG tablet Take 1 tablet (20 mg total) by mouth daily as needed for erectile dysfunction. 30 tablet 3   No current facility-administered medications for this visit.    Allergies-reviewed and updated Allergies  Allergen Reactions   Penicillins     REACTION: rash    Social History   Social History Narrative   Lives with daughter in 2 story home   Master on 1st floor      Semi-retired.   Assembly tech   Objective  Objective:  BP 112/78 (BP Location: Right Arm, Patient Position: Sitting, Cuff Size: Normal)   Pulse 75   Temp 98 F (36.7 C) (Temporal)   Ht 6' (1.829 m)   Wt 178 lb 3.2 oz (80.8 kg)   SpO2 96%   BMI 24.17 kg/m  Physical Exam  Gen: WDWN NAD HEENT: NCAT, conjunctiva not injected, sclera nonicteric TM WNL B, OP moist, no exudates  NECK:  supple, no thyromegaly, no nodes, no carotid bruits CARDIAC: RRR, S1S2+, no murmur. DP 2+B LUNGS: CTAB. No wheezes ABDOMEN:  BS+, soft, NTND, No HSM, no masses EXT:  no edema MSK: no gross abnormalities. MS 5/5 all 4 NEURO: A&O x3.  CN II-XII intact.  PSYCH: normal mood. Good eye contact     Assessment and Plan   Health Maintenance counseling: 1. Anticipatory guidance: Patient counseled regarding regular dental exams q6 months, eye exams yearly, avoiding smoking and second hand smoke, limiting alcohol to 2 beverages per day.   2. Risk factor reduction:  Advised patient of need for regular exercise and diet rich in fruits and vegetables to reduce risk of heart attack and stroke. Exercise- regularly goes to the gym.   Wt Readings from Last 3 Encounters:  12/25/22 178 lb 3.2 oz (80.8 kg)  11/30/22 178 lb 9.6 oz (81 kg)  12/19/21 186 lb 2 oz (84.4 kg)   3. Immunizations/screenings/ancillary studies Immunization History  Administered Date(s) Administered   Influenza Whole 03/09/2009   Influenza, Quadrivalent, Recombinant, Inj, Pf 04/06/2018, 03/03/2019   Influenza,inj,Quad  PF,6+ Mos 02/26/2015, 03/03/2017, 03/16/2020   Influenza,inj,quad, With Preservative 02/13/2020   Janssen (J&J) SARS-COV-2 Vaccination 08/25/2019   Moderna Covid-19 Vaccine Bivalent Booster 78yrs & up 03/28/2021   Moderna SARS-COV2 Booster Vaccination 04/23/2020, 10/28/2020   Tdap 10/28/2014   Health Maintenance Due  Topic Date Due   Zoster Vaccines- Shingrix (1 of 2) Never done   COVID-19 Vaccine (3 - 2023-24 season) 01/13/2022    4. Prostate cancer screening >55yo - risk factors?  Lab Results  Component Value Date   PSA 0.5 01/08/2020   PSA 0.43 02/14/2019   PSA 0.43 10/23/2017    5. Colon cancer screening:UTD 6. Skin cancer screening- Iadvised regular sunscreen use. Denies worrisome, changing, or new skin lesions.  7. Smoking associated screening (lung cancer screening, AAA screen 65-75, UA)- non smoker Wellness examination -  Lipid panel; Future -     Comprehensive metabolic panel; Future -     CBC with Differential/Platelet; Future -     Hemoglobin A1c; Future -     TSH; Future -     PSA; Future  Screening for prostate cancer -     PSA; Future  Mixed hyperlipidemia Assessment & Plan: Chronic.  Fair control on rosuvastatin 10 mg daily.  Triglycerides have been elevated still, but improved from previous.  Check labs  Orders: -     Lipid panel; Future  Gastroesophageal reflux disease without esophagitis Assessment & Plan: Chronic.  Controlled on omeprazole 20 mg daily.  Cannot get off of it   Migraine without aura and without status migrainosus, not intractable Assessment & Plan: Chronic.  Much less frequent since stopped eating nuts.  Sumatriptan 100 mg works if he needs it.  Does not need a refill at this time   Heart palpitations Assessment & Plan: Chronic.  Much better controlled since decreased caffeine   Other orders -     Omeprazole; Take 1 capsule (20 mg total) by mouth daily.  Dispense: 90 capsule; Refill: 3 -     Vardenafil HCl; Take 1 tablet  (20 mg total) by mouth daily as needed for erectile dysfunction.  Dispense: 30 tablet; Refill: 3   Wellness-anticipatory guidance.  Work on Diet/Exercise  Check CBC,CMP,lipids,TSH, A1C.  F/u 1 yr   Recommended follow up: Return in about 1 year (around 12/25/2023) for annual physical.     labs soon..  Lab/Order associations:non fasting   I,Rachel Rivera,acting as a scribe for Angelena Sole, MD.,have documented all relevant documentation on the behalf of Angelena Sole, MD,as directed by  Angelena Sole, MD while in the presence of Angelena Sole, MD.  I, Angelena Sole, MD, have reviewed all documentation for this visit. The documentation on 12/25/22 for the exam, diagnosis, procedures, and orders are all accurate and complete.   Angelena Sole, MD

## 2022-12-25 NOTE — Patient Instructions (Addendum)
It was very nice to see you today!  Call GI about the blood in stools.     PLEASE NOTE:  If you had any lab tests please let us know if you have not heard back within a few days. You may see your results on MyChart before we have a chance to review them but we will give you a call once they are reviewed by [place]. If we ordered any referrals today, please let us know if you have not heard from their office within the next week.   Please try these tips to maintain a healthy lifestyle:  Eat most of your calories during the day when you are active. Eliminate processed foods including packaged sweets (pies, cakes, cookies), reduce intake of potatoes, white bread, white pasta, and white Nikolov. Look for whole grain options, oat flour or almond flour.  Each meal should contain half fruits/vegetables, one quarter protein, and one quarter carbs (no bigger than a computer mouse).  Cut down on sweet beverages. This includes juice, soda, and sweet tea. Also watch fruit intake, though this is a healthier sweet option, it still contains natural sugar! Limit to 3 servings daily.  Drink at least 1 glass of water with each meal and aim for at least 8 glasses per day  Exercise at least 150 minutes every week.

## 2022-12-25 NOTE — Assessment & Plan Note (Signed)
Chronic.  Fair control on rosuvastatin 10 mg daily.  Triglycerides have been elevated still, but improved from previous.  Check labs

## 2022-12-28 ENCOUNTER — Other Ambulatory Visit (INDEPENDENT_AMBULATORY_CARE_PROVIDER_SITE_OTHER): Payer: 59

## 2022-12-28 DIAGNOSIS — E782 Mixed hyperlipidemia: Secondary | ICD-10-CM | POA: Diagnosis not present

## 2022-12-28 DIAGNOSIS — Z0000 Encounter for general adult medical examination without abnormal findings: Secondary | ICD-10-CM | POA: Diagnosis not present

## 2022-12-28 DIAGNOSIS — Z125 Encounter for screening for malignant neoplasm of prostate: Secondary | ICD-10-CM | POA: Diagnosis not present

## 2022-12-28 LAB — COMPREHENSIVE METABOLIC PANEL
ALT: 25 U/L (ref 0–53)
AST: 20 U/L (ref 0–37)
Albumin: 4.6 g/dL (ref 3.5–5.2)
Alkaline Phosphatase: 51 U/L (ref 39–117)
BUN: 17 mg/dL (ref 6–23)
CO2: 26 mEq/L (ref 19–32)
Calcium: 9.4 mg/dL (ref 8.4–10.5)
Chloride: 103 mEq/L (ref 96–112)
Creatinine, Ser: 0.94 mg/dL (ref 0.40–1.50)
GFR: 87.8 mL/min (ref >60.00)
Glucose, Bld: 104 mg/dL — ABNORMAL HIGH (ref 70–99)
Potassium: 3.8 mEq/L (ref 3.5–5.1)
Sodium: 138 mEq/L (ref 135–145)
Total Bilirubin: 0.4 mg/dL (ref 0.2–1.2)
Total Protein: 7.2 g/dL (ref 6.0–8.3)

## 2022-12-28 LAB — CBC WITH DIFFERENTIAL/PLATELET
Basophils Absolute: 0 10*3/uL (ref 0.0–0.1)
Basophils Relative: 0.2 % (ref 0.0–3.0)
Eosinophils Absolute: 0.1 10*3/uL (ref 0.0–0.7)
Eosinophils Relative: 1.8 % (ref 0.0–5.0)
HCT: 42.4 % (ref 39.0–52.0)
Hemoglobin: 14 g/dL (ref 13.0–17.0)
Lymphocytes Relative: 27.6 % (ref 12.0–46.0)
Lymphs Abs: 1.3 10*3/uL (ref 0.7–4.0)
MCHC: 33 g/dL (ref 30.0–36.0)
MCV: 89.3 fl (ref 78.0–100.0)
Monocytes Absolute: 0.4 10*3/uL (ref 0.1–1.0)
Monocytes Relative: 7.6 % (ref 3.0–12.0)
Neutro Abs: 3 10*3/uL (ref 1.4–7.7)
Neutrophils Relative %: 62.8 % (ref 43.0–77.0)
Platelets: 126 10*3/uL — ABNORMAL LOW (ref 150.0–400.0)
RBC: 4.74 Mil/uL (ref 4.22–5.81)
RDW: 12.6 % (ref 11.5–15.5)
WBC: 4.8 10*3/uL (ref 4.0–10.5)

## 2022-12-28 LAB — LIPID PANEL
Cholesterol: 128 mg/dL (ref 0–200)
HDL: 48.3 mg/dL (ref >39.00)
LDL Cholesterol: 56 mg/dL (ref 0–99)
NonHDL: 79.5
Total CHOL/HDL Ratio: 3
Triglycerides: 117 mg/dL (ref 0.0–149.0)
VLDL: 23.4 mg/dL (ref 0.0–40.0)

## 2022-12-28 LAB — PSA: PSA: 0.77 ng/mL (ref 0.10–4.00)

## 2022-12-28 LAB — TSH: TSH: 1.06 u[IU]/mL (ref 0.35–5.50)

## 2022-12-28 LAB — HEMOGLOBIN A1C: Hgb A1c MFr Bld: 5.7 % (ref 4.6–6.5)

## 2022-12-29 NOTE — Progress Notes (Signed)
Labs great!  Cholesterol huge improvement.  Sugars borderline-continue to work on diet/exercise.   Can sch 6 mo f/u if wants to monitor more closely. Platelets slightly lower.  If excessive bruising, let me know, otherwise, recheck cbcd in 2 months

## 2023-01-03 ENCOUNTER — Encounter: Payer: Self-pay | Admitting: *Deleted

## 2023-01-03 ENCOUNTER — Telehealth: Payer: Self-pay

## 2023-01-03 NOTE — Telephone Encounter (Signed)
Patient notified of message below.

## 2023-01-03 NOTE — Telephone Encounter (Signed)
Pharmacy Patient Advocate Encounter  Received notification from [facility] that Prior Authorization for Vardenafil 20mg  has been DENIED. Please advise how you'd like to proceed. Full denial letter will be uploaded to the media tab. See denial reason below.   PA #/Case ID/Reference #: [id number]

## 2023-01-08 ENCOUNTER — Encounter: Payer: Self-pay | Admitting: Family Medicine

## 2023-01-09 ENCOUNTER — Other Ambulatory Visit: Payer: Self-pay | Admitting: *Deleted

## 2023-01-09 DIAGNOSIS — H9190 Unspecified hearing loss, unspecified ear: Secondary | ICD-10-CM

## 2023-02-27 ENCOUNTER — Ambulatory Visit: Payer: 59 | Attending: Family Medicine | Admitting: Audiology

## 2023-02-27 DIAGNOSIS — H903 Sensorineural hearing loss, bilateral: Secondary | ICD-10-CM | POA: Insufficient documentation

## 2023-02-27 NOTE — Procedures (Signed)
  Outpatient Audiology and [facility] [address] [place], [place]  [address] [phone]  AUDIOLOGICAL  EVALUATION  NAME: Jonathan Mills     DOB:   08-05-1960      MRN: [id number]                                                                                     DATE: 02/27/2023     REFERENT: Jeani Sow, MD STATUS: Outpatient DIAGNOSIS: Sensorineural hearing loss bilateral  History: Jonathan Mills was seen for an audiological evaluation due to decreased hearing occurring 10 months ago.  Jonathan Mills reports a history of bilateral hearing loss.  He reports he had COVID 10 months ago and noticed a significant decrease in his hearing sensitivity, particularly in the left ear.  He reports increased difficulty communicating and hearing in the presence of background noise.  He denies otalgia, aural fullness, and dizziness.  Jonathan Mills reports intermittent tinnitus and reports his tinnitus is worse when he has migraines.  Jonathan Mills was seen in 2017 for an audiological evaluation at [facility].  He was also seen in 2022 at an outside location for a hearing test.  Evaluation:  Otoscopy showed a clear view of the tympanic membranes, bilaterally Tympanometry results were consistent with normal middle ear pressure and normal tympanic membrane mobility (Type A) bilaterally Audiometric testing was completed using Conventional Audiometry techniques with insert earphones and TDH headphones. Test results are consistent with normal hearing sensitivity at 250 Hz to 2000 Hz sloping to a mild to moderate sensorineural hearing loss at 3000-8000 Hz, bilaterally.  A sensorineural asymmetry is noted at 3000 to 6000 Hz, worse in the left ear.  Speech Recognition Thresholds were obtained at 15 dB HL in the right ear and at 20 dB HL in the left ear. Word Recognition Testing was completed at 70 dB HL and Jonathan Mills scored 92% in the right ear and 100% in the left ear.   Results:  The test results were reviewed with Jonathan Mills.  Today's  audiometric results are consistent with normal hearing sensitivity at 250 Hz to 2000 Hz sloping to a mild to moderate sensorineural hearing loss, bilaterally.  A sensorineural asymmetry is noted at 3000 to 6000 Hz, worse in the left ear.  Jonathan Mills will benefit from the use of good communication strategies and continued audiological monitoring.  Hearing aids are not recommended at this time.  A referral to an ENT was reviewed due to asymmetric high-frequency hearing loss.  Jonathan Mills brought his previous audiograms for comparison.  In comparison to the last audiologic of completed in 2022, hearing thresholds are stable.  Recommendations: 1.  Referral to an ENT for further evaluation of asymmetric sensorineural hearing loss, worse in the left ear, if motivated 2.  Audiological evaluation, return in 2 to 3 years for hearing test.  30 minutes spent testing and counseling on results.   If you have any questions please feel free to contact me at [phone].  The audiogram can be found under the media tab.  Marton Redwood Audiologist, Au.D., CCC-A 02/27/2023  2:12 PM  Cc: Jeani Sow, MD

## 2023-03-08 LAB — HM COLONOSCOPY

## 2023-03-16 ENCOUNTER — Other Ambulatory Visit (HOSPITAL_BASED_OUTPATIENT_CLINIC_OR_DEPARTMENT_OTHER): Payer: Self-pay

## 2023-03-16 MED ORDER — COVID-19 MRNA VAC-TRIS(PFIZER) 30 MCG/0.3ML IM SUSY
0.3000 mL | PREFILLED_SYRINGE | Freq: Once | INTRAMUSCULAR | 0 refills | Status: AC
  Filled 2023-03-16: qty 0.3, 1d supply, fill #0

## 2023-03-16 MED ORDER — INFLUENZA VIRUS VACC SPLIT PF (FLUZONE) 0.5 ML IM SUSY
0.5000 mL | PREFILLED_SYRINGE | Freq: Once | INTRAMUSCULAR | 0 refills | Status: AC
  Filled 2023-03-16: qty 0.5, 1d supply, fill #0

## 2023-04-27 ENCOUNTER — Encounter (HOSPITAL_BASED_OUTPATIENT_CLINIC_OR_DEPARTMENT_OTHER): Payer: Self-pay | Admitting: Cardiology

## 2023-04-27 MED ORDER — SUMATRIPTAN SUCCINATE 100 MG PO TABS: ORAL_TABLET | ORAL | 5 refills | Status: AC

## 2023-06-30 ENCOUNTER — Other Ambulatory Visit: Payer: Self-pay | Admitting: Family Medicine

## 2023-12-27 ENCOUNTER — Ambulatory Visit (INDEPENDENT_AMBULATORY_CARE_PROVIDER_SITE_OTHER): Payer: 59 | Admitting: Family Medicine

## 2023-12-27 ENCOUNTER — Encounter: Payer: Self-pay | Admitting: Family Medicine

## 2023-12-27 ENCOUNTER — Ambulatory Visit: Payer: Self-pay | Admitting: Family Medicine

## 2023-12-27 VITALS — BP 121/80 | HR 76 | Temp 97.9°F | Resp 16 | Ht 72.0 in | Wt 181.0 lb

## 2023-12-27 DIAGNOSIS — H6502 Acute serous otitis media, left ear: Secondary | ICD-10-CM

## 2023-12-27 DIAGNOSIS — Z125 Encounter for screening for malignant neoplasm of prostate: Secondary | ICD-10-CM

## 2023-12-27 DIAGNOSIS — E559 Vitamin D deficiency, unspecified: Secondary | ICD-10-CM | POA: Diagnosis not present

## 2023-12-27 DIAGNOSIS — Z0000 Encounter for general adult medical examination without abnormal findings: Secondary | ICD-10-CM

## 2023-12-27 DIAGNOSIS — G43009 Migraine without aura, not intractable, without status migrainosus: Secondary | ICD-10-CM | POA: Diagnosis not present

## 2023-12-27 DIAGNOSIS — K219 Gastro-esophageal reflux disease without esophagitis: Secondary | ICD-10-CM

## 2023-12-27 DIAGNOSIS — E782 Mixed hyperlipidemia: Secondary | ICD-10-CM | POA: Diagnosis not present

## 2023-12-27 DIAGNOSIS — Z79899 Other long term (current) drug therapy: Secondary | ICD-10-CM | POA: Diagnosis not present

## 2023-12-27 LAB — CBC WITH DIFFERENTIAL/PLATELET
Basophils Absolute: 0 K/uL (ref 0.0–0.1)
Basophils Relative: 0.3 % (ref 0.0–3.0)
Eosinophils Absolute: 0 K/uL (ref 0.0–0.7)
Eosinophils Relative: 1 % (ref 0.0–5.0)
HCT: 44.7 % (ref 39.0–52.0)
Hemoglobin: 14.8 g/dL (ref 13.0–17.0)
Lymphocytes Relative: 23.5 % (ref 12.0–46.0)
Lymphs Abs: 1 K/uL (ref 0.7–4.0)
MCHC: 33.1 g/dL (ref 30.0–36.0)
MCV: 88.2 fl (ref 78.0–100.0)
Monocytes Absolute: 0.4 K/uL (ref 0.1–1.0)
Monocytes Relative: 10 % (ref 3.0–12.0)
Neutro Abs: 2.8 K/uL (ref 1.4–7.7)
Neutrophils Relative %: 65.2 % (ref 43.0–77.0)
Platelets: 115 K/uL — ABNORMAL LOW (ref 150.0–400.0)
RBC: 5.07 Mil/uL (ref 4.22–5.81)
RDW: 12.7 % (ref 11.5–15.5)
WBC: 4.3 K/uL (ref 4.0–10.5)

## 2023-12-27 LAB — COMPREHENSIVE METABOLIC PANEL WITH GFR
ALT: 22 U/L (ref 0–53)
AST: 19 U/L (ref 0–37)
Albumin: 4.5 g/dL (ref 3.5–5.2)
Alkaline Phosphatase: 51 U/L (ref 39–117)
BUN: 14 mg/dL (ref 6–23)
CO2: 26 meq/L (ref 19–32)
Calcium: 9.3 mg/dL (ref 8.4–10.5)
Chloride: 104 meq/L (ref 96–112)
Creatinine, Ser: 1.05 mg/dL (ref 0.40–1.50)
GFR: 76.35 mL/min (ref >60.00)
Glucose, Bld: 87 mg/dL (ref 70–99)
Potassium: 3.8 meq/L (ref 3.5–5.1)
Sodium: 139 meq/L (ref 135–145)
Total Bilirubin: 0.4 mg/dL (ref 0.2–1.2)
Total Protein: 7.4 g/dL (ref 6.0–8.3)

## 2023-12-27 LAB — HEMOGLOBIN A1C: Hgb A1c MFr Bld: 6 % (ref 4.6–6.5)

## 2023-12-27 LAB — LIPID PANEL
Cholesterol: 148 mg/dL (ref 0–200)
HDL: 49.9 mg/dL (ref >39.00)
LDL Cholesterol: 67 mg/dL (ref 0–99)
NonHDL: 97.96
Total CHOL/HDL Ratio: 3
Triglycerides: 154 mg/dL — ABNORMAL HIGH (ref 0.0–149.0)
VLDL: 30.8 mg/dL (ref 0.0–40.0)

## 2023-12-27 LAB — VITAMIN B12: Vitamin B-12: 260 pg/mL (ref 211–911)

## 2023-12-27 LAB — PSA: PSA: 0.75 ng/mL (ref 0.10–4.00)

## 2023-12-27 LAB — VITAMIN D 25 HYDROXY (VIT D DEFICIENCY, FRACTURES): VITD: 55.55 ng/mL (ref 30.00–100.00)

## 2023-12-27 LAB — TSH: TSH: 1.17 u[IU]/mL (ref 0.35–5.50)

## 2023-12-27 MED ORDER — OMEPRAZOLE 20 MG PO CPDR: 20.0000 mg | DELAYED_RELEASE_CAPSULE | Freq: Every day | ORAL | 3 refills | Status: AC

## 2023-12-27 MED ORDER — ROSUVASTATIN CALCIUM 10 MG PO TABS: 10.0000 mg | ORAL_TABLET | Freq: Every day | ORAL | 1 refills | Status: AC

## 2023-12-27 MED ORDER — VARDENAFIL HCL 20 MG PO TABS: 20.0000 mg | ORAL_TABLET | Freq: Every day | ORAL | 3 refills | Status: AC | PRN

## 2023-12-27 MED ORDER — CEFDINIR 300 MG PO CAPS: 300.0000 mg | ORAL_CAPSULE | Freq: Two times a day (BID) | ORAL | 0 refills | Status: DC

## 2023-12-27 NOTE — Patient Instructions (Addendum)
 It was very nice to see you today!  Rest.  Check covid  Shingrix and pneumonia shots. B12-dissolvable or sublingual   PLEASE NOTE:  If you had any lab tests please let us  know if you have not heard back within a few days. You may see your results on MyChart before we have a chance to review them but we will give you a call once they are reviewed by us . If we ordered any referrals today, please let us  know if you have not heard from their office within the next week.   Please try these tips to maintain a healthy lifestyle:  Eat most of your calories during the day when you are active. Eliminate processed foods including packaged sweets (pies, cakes, cookies), reduce intake of potatoes, white bread, white pasta, and white Risk. Look for whole grain options, oat flour or almond flour.  Each meal should contain half fruits/vegetables, one quarter protein, and one quarter carbs (no bigger than a computer mouse).  Cut down on sweet beverages. This includes juice, soda, and sweet tea. Also watch fruit intake, though this is a healthier sweet option, it still contains natural sugar! Limit to 3 servings daily.  Drink at least 1 glass of water  with each meal and aim for at least 8 glasses per day  Exercise at least 150 minutes every week.

## 2023-12-27 NOTE — Progress Notes (Signed)
 Labs great except  Platelets a little lower-could be from illness.  Repeat cbcd 1 month A1C(3 month average of sugars) is elevated.  This is considered PreDiabetes.  Work on diet-decrease sugars and starches and aim for 30 minutes of exercise 5 days/week to prevent progression to diabetes  B12 low-take it as we discussed

## 2023-12-27 NOTE — Progress Notes (Signed)
 Phone: [phone]   Subjective:  Patient 63 y.o. male presenting for annual physical.  Chief Complaint  Patient presents with   Annual Exam    CPE Not fasting   Headache    Has been taking Advil    Sore Throat   Fatigue    Sx started 1 week ago   Ear Pain    Left ear pain   Annual-exercise Discussed the use of AI scribe software for clinical note transcription with the patient, who gave verbal consent to proceed.  History of Present Illness Jonathan Mills is a 63 year old male who presents with sore throat, headache, and fatigue.  Approximately two weeks ago, he traveled to [place]  and experienced a migraine, which he attributes to travel stress. He took sumatriptan , which managed the migraine to a manageable level, though it did not completely resolve it. Typically, his migraines resolve over four to five days, but this time it persisted.  Upon returning from [place] , he experienced an ear problem on the airplane, and the headaches returned, though they did not feel like migraines, so he did not take migraine medication. Over the past week, he developed additional symptoms including sore throat, fatigue, and a runny nose. These symptoms peaked two days ago. He has been managing the symptoms with Advil , which reduces the headache and fatigue enough to allow him to work part-time.  He reports occasional trouble with urination, describing it as slow and low volume, sometimes feeling like he is not emptying completely. He attributes this to aging and notes it is infrequent.  He takes several medications regularly: Levitra , sumatriptan , rosuvastatin  10 mg daily, omeprazole  20 mg daily, and Xyzal (levocetirizine) daily for allergies. He also takes vitamin D  supplements, which he recalls were recommended by his gastroenterologist after long-term use of omeprazole .  He had a colonoscopy within the last 12 months, which identified an internal hemorrhoid as the cause of blood in his stools. He  works part-time and lives with his daughter. He does not smoke or drink excessively.    See problem oriented charting- ROS- ROS: Gen: no fever, chills  Skin: no rash, itching ENT: HPI Eyes: no blurry vision, double vision Resp: no cough, wheeze,SOB CV: no CP, palpitations, LE edema,  GI: no heartburn, n/v/d/c, abd pain GU: no dysuria, urgency, frequency, hematuria.  Occ slower stream MSK: no joint pain, myalgias, back pain Neuro: no dizziness, headache, weakness, vertigo Psych: no depression, anxiety, insomnia, SI   The following were reviewed and entered/updated in epic: Past Medical History:  Diagnosis Date   ALLERGIC RHINITIS 10/07/2008   COLONIC POLYPS, HX OF 10/07/2008   GERD 10/07/2008   Headache(784.0)    sinus   HYPERLIPIDEMIA 10/07/2008   Hypertriglyceridemia 07/12/2011   Mood swings    Vitamin D  deficiency 07/12/2011   Patient Active Problem List   Diagnosis Date Noted   Heart palpitations 02/23/2021   Acute pain of right foot 02/23/2021   Migraines 02/05/2019   Back pain 10/28/2015   Memory difficulty 10/28/2014   Vitamin D  deficiency 07/12/2011   Hyperlipidemia 10/07/2008   Allergic rhinitis 10/07/2008   GERD 10/07/2008   History of colonic polyps 10/07/2008   Past Surgical History:  Procedure Laterality Date   EYE SURGERY     LASIK     right ear/neck surgury  05/16/1995   SHOULDER ARTHROSCOPY WITH SUBACROMIAL DECOMPRESSION, ROTATOR CUFF REPAIR AND BICEP TENDON REPAIR Left 12/18/2013   Procedure: LEFT SHOULDER ARTHROSCOPY WITH SUBACROMIAL DECOMPRESSION,DISTAL CLAVICAL RESECTION ROTATOR CUFF REPAIR ;  Surgeon: Franky CHRISTELLA Pointer, MD;  Location: [facility] OR;  Service: Orthopedics;  Laterality: Left;   SHOULDER SURGERY Right 03/04/2021    Family History  Problem Relation Age of Onset   Cancer Father 53       brain    Medications- reviewed and updated Current Outpatient Medications  Medication Sig Dispense Refill   cefdinir  (OMNICEF ) 300 MG capsule Take 1 capsule  (300 mg total) by mouth 2 (two) times daily. 14 capsule 0   cholecalciferol (VITAMIN D3) 25 MCG (1000 UNIT) tablet Take 1,000 Units by mouth daily.     levocetirizine (XYZAL) 5 MG tablet Take 5 mg by mouth every evening.     SUMAtriptan  (IMITREX ) 100 MG tablet Take 1 tablet earliest onset of migraine.  May repeat once in 2 hours if headache persists or recurs. 10 tablet 5   omeprazole  (PRILOSEC) 20 MG capsule Take 1 capsule (20 mg total) by mouth daily. 90 capsule 3   rosuvastatin  (CRESTOR ) 10 MG tablet Take 1 tablet (10 mg total) by mouth daily. 90 tablet 1   vardenafil  (LEVITRA ) 20 MG tablet Take 1 tablet (20 mg total) by mouth daily as needed for erectile dysfunction. 30 tablet 3   No current facility-administered medications for this visit.    Allergies-reviewed and updated Allergies  Allergen Reactions   Penicillins Other (See Comments)    REACTION: rash    Social History   Social History Narrative   Lives with daughter in 2 story home   Master on 1st floor      Semi-retired.   Assembly tech   Objective  Objective:  BP 121/80   Pulse 76   Temp 97.9 F (36.6 C) (Temporal)   Resp 16   Ht 6' (1.829 m)   Wt 181 lb (82.1 kg)   SpO2 97%   BMI 24.55 kg/m  Physical Exam  Gen: WDWN NAD HEENT: NCAT, conjunctiva not injected, sclera nonicteric TM WNL R,  red on L, OP moist, no exudates  but red NECK:  supple, no thyromegaly, + submand nodes, no carotid bruits CARDIAC: RRR, S1S2+, no murmur. DP 2+B LUNGS: CTAB. No wheezes ABDOMEN:  BS+, soft, NTND, No HSM, no masses EXT:  no edema MSK: no gross abnormalities. MS 5/5 all 4 NEURO: A&O x3.  CN II-XII intact.  PSYCH: normal mood. Good eye contact     Assessment and Plan   Health Maintenance counseling: 1. Anticipatory guidance: Patient counseled regarding regular dental exams q6 months, eye exams yearly, avoiding smoking and second hand smoke, limiting alcohol to 2 beverages per day.   2. Risk factor reduction:  Advised  patient of need for regular exercise and diet rich in fruits and vegetables to reduce risk of heart attack and stroke. Exercise- +.   Wt Readings from Last 3 Encounters:  12/27/23 181 lb (82.1 kg)  12/25/22 178 lb 3.2 oz (80.8 kg)  11/30/22 178 lb 9.6 oz (81 kg)   3. Immunizations/screenings/ancillary studies Immunization History  Administered Date(s) Administered   Influenza Inj Mdck Quad Pf 03/03/2017, 03/16/2020   Influenza Whole 03/09/2009   Influenza, Quadrivalent, Recombinant, Inj, Pf 04/06/2018, 03/03/2019   Influenza, Seasonal, Injecte, Preservative Fre 03/16/2023   Influenza,inj,Quad PF,6+ Mos 02/26/2015, 03/03/2017, 03/16/2020   Influenza,inj,quad, With Preservative 02/13/2020   Janssen (J&J) SARS-COV-2 Vaccination 08/25/2019   Moderna Covid-19 Vaccine Bivalent Booster 79yrs & up 03/28/2021   Moderna SARS-COV2 Booster Vaccination 04/23/2020, 10/28/2020   Pfizer(Comirnaty )Fall Seasonal Vaccine 12 years and older 03/16/2023   Tdap 10/28/2014  Health Maintenance Due  Topic Date Due   INFLUENZA VACCINE  12/14/2023    4. Prostate cancer screening >55yo - risk factors?  Lab Results  Component Value Date   PSA 0.77 12/28/2022   PSA 0.5 01/08/2020   PSA 0.43 02/14/2019    5. Colon cancer screening:utd 6. Skin cancer screening- Iadvised regular sunscreen use. Denies worrisome, changing, or new skin lesions.  7. Smoking associated screening (lung cancer screening, AAA screen 65-75, UA)- non smoker-   Wellness examination -     Lipid panel -     Comprehensive metabolic panel with GFR -     CBC with Differential/Platelet -     Hemoglobin A1c -     TSH -     PSA -     Vitamin B12 -     VITAMIN D  25 Hydroxy (Vit-D Deficiency, Fractures)  Mixed hyperlipidemia -     Rosuvastatin  Calcium ; Take 1 tablet (10 mg total) by mouth daily.  Dispense: 90 tablet; Refill: 1  Gastroesophageal reflux disease without esophagitis -     Omeprazole ; Take 1 capsule (20 mg total) by mouth  daily.  Dispense: 90 capsule; Refill: 3  Migraine without aura and without status migrainosus, not intractable  Screening for prostate cancer -     PSA  High risk medication use -     Vitamin B12 -     VITAMIN D  25 Hydroxy (Vit-D Deficiency, Fractures)  Vitamin D  deficiency -     VITAMIN D  25 Hydroxy (Vit-D Deficiency, Fractures)  Non-recurrent acute serous otitis media of left ear  Other orders -     Vardenafil  HCl; Take 1 tablet (20 mg total) by mouth daily as needed for erectile dysfunction.  Dispense: 30 tablet; Refill: 3 -     Cefdinir ; Take 1 capsule (300 mg total) by mouth 2 (two) times daily.  Dispense: 14 capsule; Refill: 0   Wellness-anticipatory guidance.  Work on Diet/Exercise  Check CBC,CMP,lipids,TSH, A1C.  F/u 1 yr   defer pneumo and shingrix d/t illness now-can return or do at pharm Assessment and Plan Assessment & Plan Acute viral upper respiratory infection with left otitis media   Symptoms include sore throat, fatigue, headache, runny nose, and congestion, worsening over the past week. Examination reveals redness in the left ear, indicating otitis media, likely of viral origin given current trends. Differential diagnosis includes COVID-19 and other viral infections. Prescribe Omnicef  for otitis media. Advise rest, hydration, and use of Aleve  or Advil  for symptom relief. Instruct to perform a COVID-19 test at home   Migraine without aura   Chronic migraines are managed with sumatriptan . A recent migraine, associated with travel stress, was managed with sumatriptan , reducing severity to a manageable level. Continue sumatriptan  as needed.  Hyperlipidemia   Managed with rosuvastatin  10 mg daily, resulting in significant improvement in lipid levels. Emphasize the importance of diet and exercise alongside medication. Continue rosuvastatin  10 mg daily and encourage adherence to diet and exercise regimen.  Gastroesophageal reflux disease (GERD)   Symptoms are  well-controlled with omeprazole  20 mg daily. No reports of food impaction or blood in stools, though internal hemorrhoids were noted on a recent colonoscopy. Continue omeprazole  20 mg daily.  Benign prostatic hyperplasia with lower urinary tract symptoms   Reports occasional slow urinary stream and low volume, not bothersome enough to seek treatment. No nocturia. Advise monitoring caffeine intake as it may exacerbate symptoms.    Recommended follow up: Return in about 1 year (around 12/26/2024) for  annual physical.  Lab/Order associations:not fasting   Jenkins CHRISTELLA Carrel, MD

## 2023-12-28 ENCOUNTER — Other Ambulatory Visit: Payer: Self-pay | Admitting: *Deleted

## 2023-12-28 DIAGNOSIS — D696 Thrombocytopenia, unspecified: Secondary | ICD-10-CM

## 2024-01-07 ENCOUNTER — Other Ambulatory Visit: Payer: Self-pay | Admitting: Family Medicine

## 2024-01-07 DIAGNOSIS — K219 Gastro-esophageal reflux disease without esophagitis: Secondary | ICD-10-CM

## 2024-01-09 IMAGING — CT CT CARDIAC CORONARY ARTERY CALCIUM SCORE
3 series · 14 of 20 positions shown, 16 images · non-contrast
Comparison: None.
COMPARISON: None.

Addendum:
EXAM:
OVER-READ INTERPRETATION  CT CHEST

The following report is an over-read performed by radiologist Dr.
Rene Edgar Yalusqui [REDACTED] on 06/14/2021. This over-read
does not include interpretation of cardiac or coronary anatomy or
pathology. The coronary calcium score interpretation by the
cardiologist is attached.
TECHNIQUE: A gated, non-contrast computed tomography scan of the heart was
performed using 3mm slice thickness. Axial images were analyzed on a
dedicated workstation. Calcium scoring of the coronary arteries was
performed using the Agatston method.

[Series 2: cascseq 2.0 sa36 (id) (id) · axial · 0.39mm/px · z∈[-266,-164]mm · 4 of 85 slices shown]
[im 17/85  vessel]
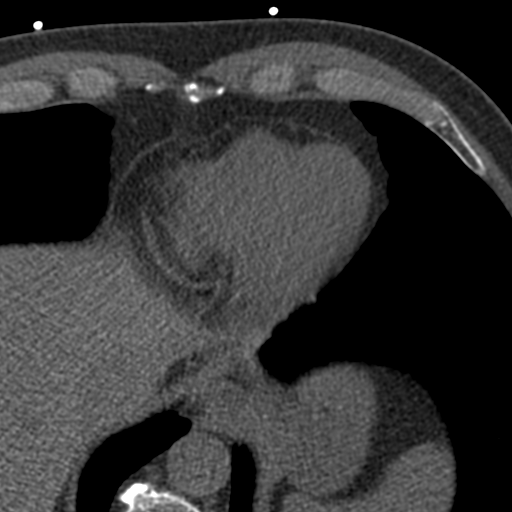
[im 34/85  vessel]
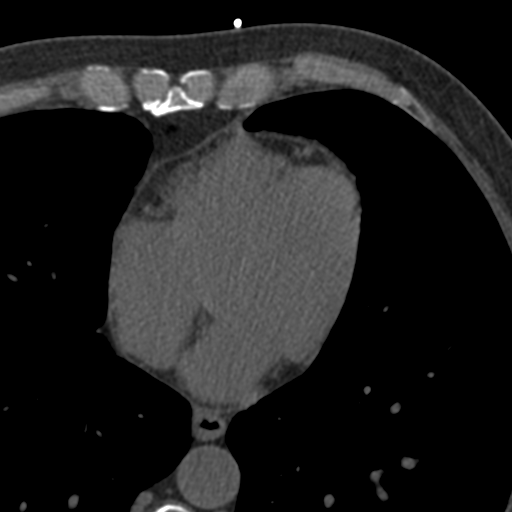
[im 51/85  vessel]
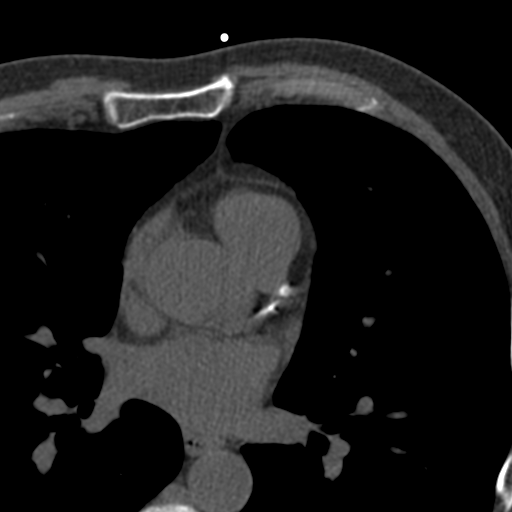
[im 68/85  vessel]
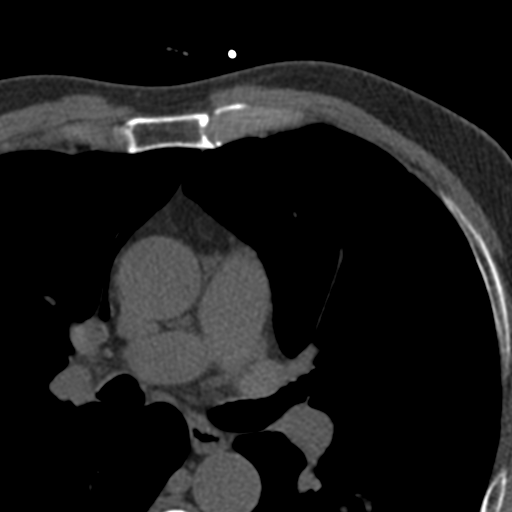

[Series 3: cascseq 2.0 bf37 st · axial · 0.73mm/px · z∈[-270,-158]mm · 5 of 85 slices shown, 7 images]
[im 15/85  vessel]
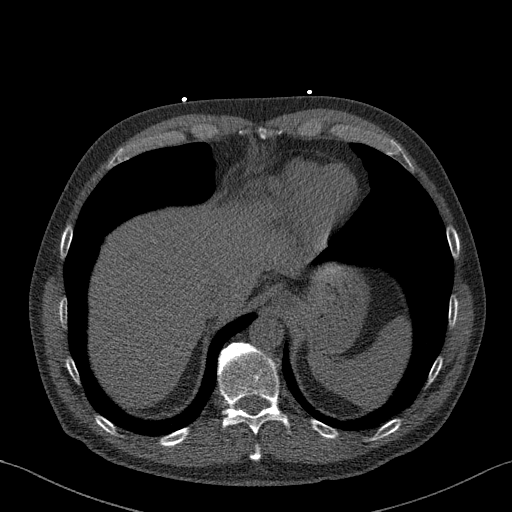
[im 15/85  lung]
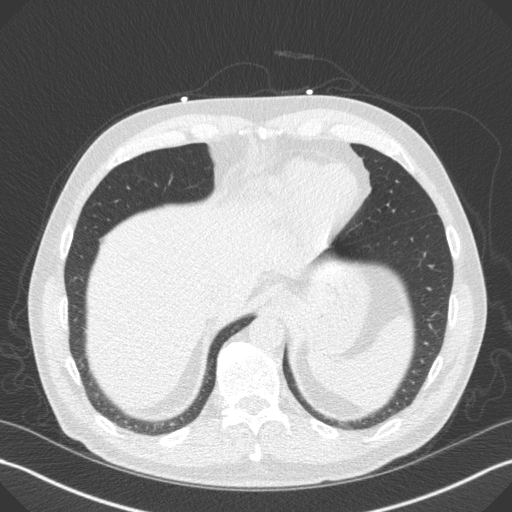
[im 29/85  vessel]
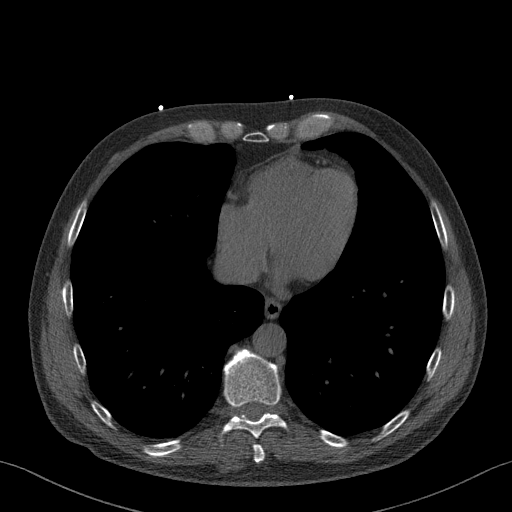
[im 43/85  vessel]
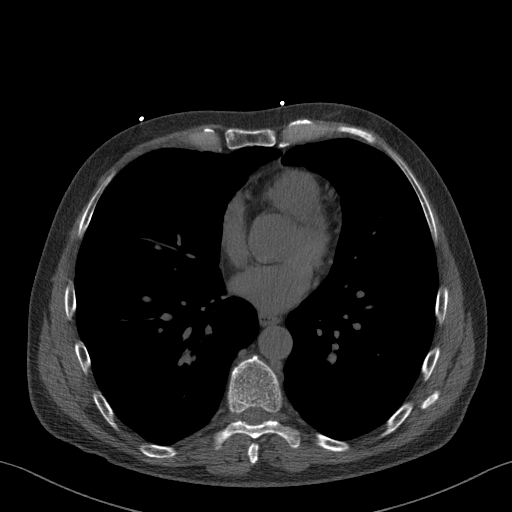
[im 57/85  vessel]
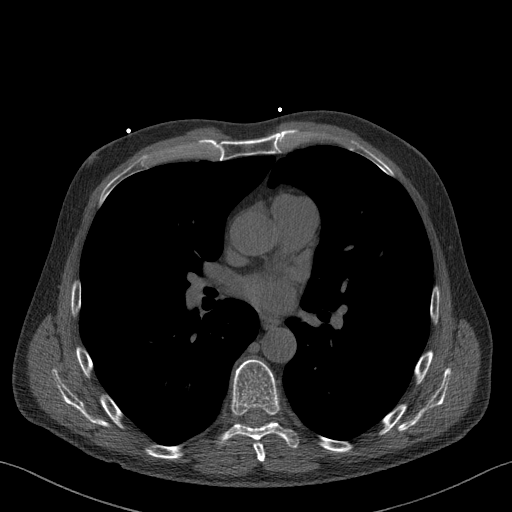
[im 71/85  vessel]
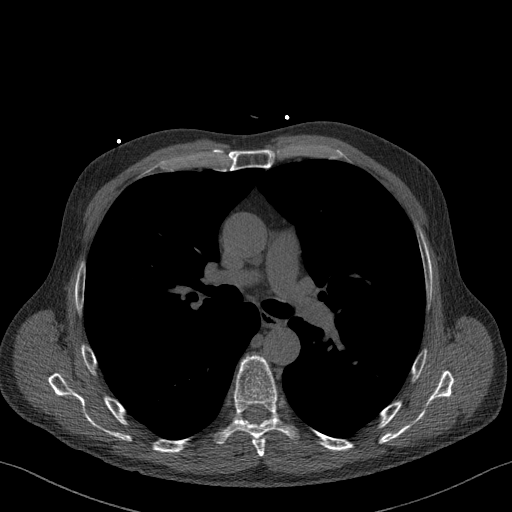
[im 71/85  lung]
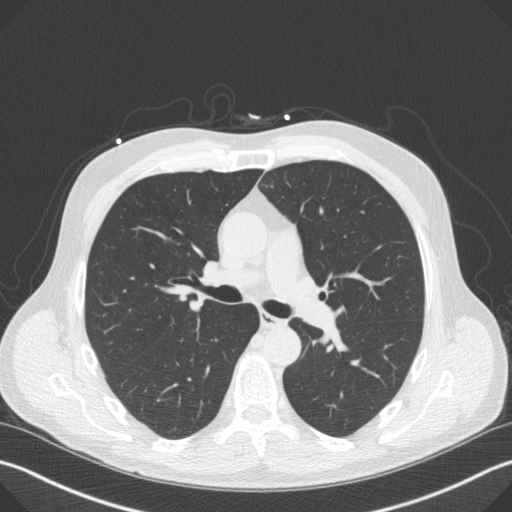

[Series 4: cascseq 2.0 br59 lung · axial · 0.73mm/px · z∈[-270,-158]mm · 5 of 85 slices shown]
[im 15/85  lung]
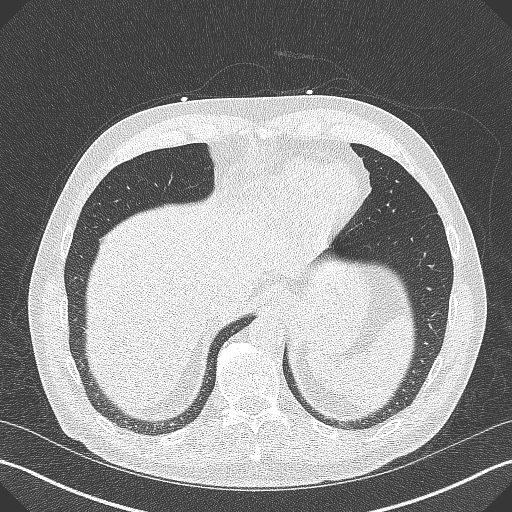
[im 29/85  lung]
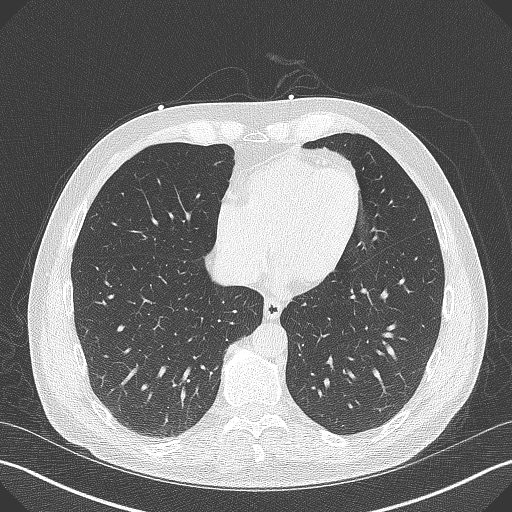
[im 43/85  lung]
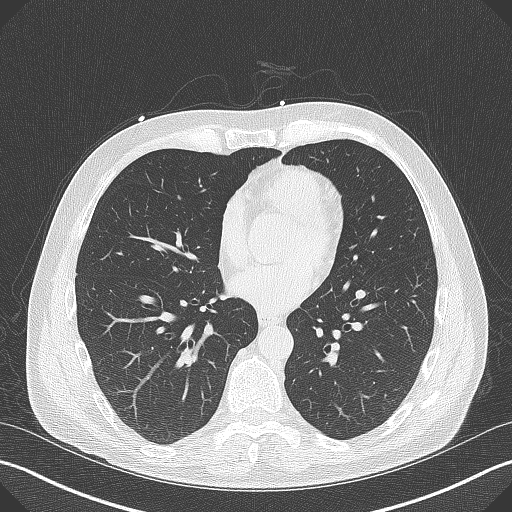
[im 57/85  lung]
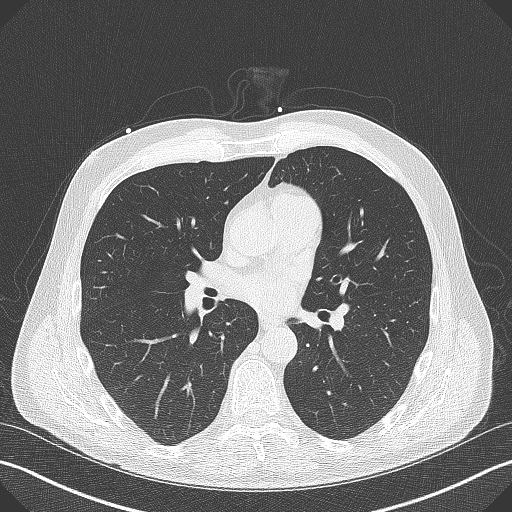
[im 71/85  lung]
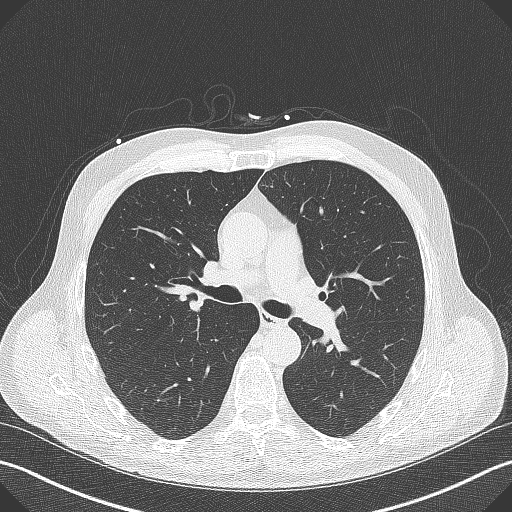

[14 of 20 positions shown; findings below may reference images not displayed]

FINDINGS: Vascular: Heart is normal size.  Aorta normal caliber.

Mediastinum/Nodes: No adenopathy

Lungs/Pleura: No confluent airspace opacities or effusions.

Upper Abdomen: Scattered subcentimeter hypodensities in the a patent
dome, likely cysts.

Musculoskeletal: Chest wall soft tissues are unremarkable. No acute
bony abnormality.
IMPRESSION: No acute extra cardiac abnormality.

Scattered subcentimeter hypodensities in the liver, difficult to
characterize on this noncontrast study but most likely cysts.

ADDENDUM:
Cardiovascular Disease Risk stratification

EXAM:
Coronary Calcium Score
FINDINGS: Coronary arteries: Normal origins.

Coronary Calcium Score:

Left main: 0

Left anterior descending artery: 317

Left circumflex artery: 0

Right coronary artery: 0

Total: 317

Percentile: 87

Pericardium: Normal.

Ascending Aorta: Normal caliber.

Non-cardiac: See separate report from [REDACTED].
IMPRESSION: Coronary calcium score of 317. This was 87 percentile for age-,
race-, and sex-matched controls.



If CAC=0, it is reasonable to withhold statin therapy and reassess
in 5 to 10 years, as long as higher risk conditions are absent
(diabetes mellitus, family history of premature CHD in first degree
relatives (males <55 years; females <65 years), cigarette smoking,
or LDL >=190 mg/dL).

If CAC is 1 to 99, it is reasonable to initiate statin therapy for
patients >=55 years of age.

If CAC is >=100 or >=75th percentile, it is reasonable to initiate
statin therapy at any age.

Cardiology referral should be considered for patients with CAC
scores >=400 or >=75th percentile.

*9079 AHA/ACC/AACVPR/AAPA/ABC/THREEINN/HYACINTH/KIKI/Rilee/MADSEN/ABRAHAMSE/CHOUDHARY
Guideline on the Management of Blood Cholesterol: A Report of the
American College of Cardiology/American Heart Association Task Force
on Clinical Practice Guidelines. J Am Coll Cardiol.
4334;73(24):5680-5617.

Ashely Jim, DO [REDACTED]

*** End of Addendum ***
EXAM:
OVER-READ INTERPRETATION  CT CHEST

The following report is an over-read performed by radiologist Dr.
Rene Edgar Yalusqui [REDACTED] on 06/14/2021. This over-read
does not include interpretation of cardiac or coronary anatomy or
pathology. The coronary calcium score interpretation by the
cardiologist is attached.
FINDINGS: Vascular: Heart is normal size.  Aorta normal caliber.

Mediastinum/Nodes: No adenopathy

Lungs/Pleura: No confluent airspace opacities or effusions.

Upper Abdomen: Scattered subcentimeter hypodensities in the a patent
dome, likely cysts.

Musculoskeletal: Chest wall soft tissues are unremarkable. No acute
bony abnormality.
IMPRESSION: No acute extra cardiac abnormality.

Scattered subcentimeter hypodensities in the liver, difficult to
characterize on this noncontrast study but most likely cysts.

## 2024-01-30 ENCOUNTER — Other Ambulatory Visit

## 2024-03-12 ENCOUNTER — Telehealth: Admitting: Physician Assistant

## 2024-03-12 DIAGNOSIS — B9689 Other specified bacterial agents as the cause of diseases classified elsewhere: Secondary | ICD-10-CM | POA: Diagnosis not present

## 2024-03-12 DIAGNOSIS — J019 Acute sinusitis, unspecified: Secondary | ICD-10-CM | POA: Diagnosis not present

## 2024-03-12 MED ORDER — DOXYCYCLINE HYCLATE 100 MG PO TABS: 100.0000 mg | ORAL_TABLET | Freq: Two times a day (BID) | ORAL | 0 refills | Status: DC

## 2024-03-12 MED ORDER — PREDNISONE 10 MG (21) PO TBPK: ORAL_TABLET | ORAL | 0 refills | Status: DC

## 2024-03-12 NOTE — Progress Notes (Signed)
 E-Visit for Sinus Problems  We are sorry that you are not feeling well.  Here is how we plan to help!  Based on what you have shared with me it looks like you have sinusitis.  Sinusitis is inflammation and infection in the sinus cavities of the head.  Based on your presentation I believe you most likely have Acute Bacterial Sinusitis.  This is an infection caused by bacteria and is treated with antibiotics. I have prescribed Doxycycline 100mg  by mouth twice a day for 7 days. I have also prescribed a prednisone  tablet pack to take as directed.  You may use an oral decongestant such as Mucinex D or if you have glaucoma or high blood pressure use plain Mucinex. Saline nasal spray help and can safely be used as often as needed for congestion.  If you develop worsening sinus pain, fever or notice severe headache and vision changes, or if symptoms are not better after completion of antibiotic, please schedule an appointment with a health care provider.    Sinus infections are not as easily transmitted as other respiratory infection, however we still recommend that you avoid close contact with loved ones, especially the very young and elderly.  Remember to wash your hands thoroughly throughout the day as this is the number one way to prevent the spread of infection!  Home Care: Only take medications as instructed by your medical team. Complete the entire course of an antibiotic. Do not take these medications with alcohol. A steam or ultrasonic humidifier can help congestion.  You can place a towel over your head and breathe in the steam from hot water  coming from a faucet. Avoid close contacts especially the very young and the elderly. Cover your mouth when you cough or sneeze. Always remember to wash your hands.  Get Help Right Away If: You develop worsening fever or sinus pain. You develop a severe head ache or visual changes. Your symptoms persist after you have completed your treatment  plan.  Make sure you Understand these instructions. Will watch your condition. Will get help right away if you are not doing well or get worse.  Your e-visit answers were reviewed by a board certified advanced clinical practitioner to complete your personal care plan.  Depending on the condition, your plan could have included both over the counter or prescription medications.  If there is a problem please reply  once you have received a response from your provider.  Your safety is important to us .  If you have drug allergies check your prescription carefully.    You can use MyChart to ask questions about today's visit, request a non-urgent call back, or ask for a work or school excuse for 24 hours related to this e-Visit. If it has been greater than 24 hours you will need to follow up with your provider, or enter a new e-Visit to address those concerns.  You will get an e-mail in the next two days asking about your experience.  I hope that your e-visit has been valuable and will speed your recovery. Thank you for using e-visits.  I have spent 5 minutes in review of e-visit questionnaire, review and updating patient chart, medical decision making and response to patient.   Elsie Velma Lunger, PA-C

## 2024-04-16 ENCOUNTER — Telehealth: Admitting: Family Medicine

## 2024-04-16 DIAGNOSIS — J329 Chronic sinusitis, unspecified: Secondary | ICD-10-CM

## 2024-04-16 NOTE — Progress Notes (Signed)
 Because you recently had treatment and continue to have symptoms you will need to follow up in person for eval and assessment on next steps. We feel your condition warrants further evaluation and  recommend that you be seen in a face-to-face visit at your local urgent care or PCP office.   NOTE: There will be NO CHARGE for this E-Visit   If you are having a true medical emergency, please call 911.

## 2024-04-17 ENCOUNTER — Encounter: Payer: Self-pay | Admitting: Family Medicine

## 2024-04-17 ENCOUNTER — Inpatient Hospital Stay: Admission: RE | Admit: 2024-04-17 | Discharge: 2024-04-17 | Attending: Family Medicine

## 2024-04-17 ENCOUNTER — Other Ambulatory Visit: Payer: Self-pay

## 2024-04-17 VITALS — BP 135/87 | HR 76 | Temp 97.8°F | Resp 18

## 2024-04-17 DIAGNOSIS — J0101 Acute recurrent maxillary sinusitis: Secondary | ICD-10-CM | POA: Diagnosis not present

## 2024-04-17 MED ORDER — DOXYCYCLINE HYCLATE 100 MG PO CAPS: 100.0000 mg | ORAL_CAPSULE | Freq: Two times a day (BID) | ORAL | 0 refills | Status: AC

## 2024-04-17 NOTE — Discharge Instructions (Addendum)
 Start doxycycline  twice daily for 7 days.  Continue nasal sprays and nasal rinses as needed.  Please follow-up with your PCP or ear nose and throat given your recurrent infections.  Please go to the ER for any worsening symptoms.  Hope you feel better soon!

## 2024-04-17 NOTE — ED Triage Notes (Signed)
 Pt c/o left sided facial congestion, left nasal congestion, left side post nasal drip, sinus HAx1.5wks.

## 2024-04-17 NOTE — ED Provider Notes (Signed)
 [facility]    CSN: [id number] Arrival date & time: 04/17/24  0917      History   Chief Complaint Chief Complaint  Patient presents with   Nasal Congestion    see MyChart for more info, seems like I have a sinus infection that wont go away.   This will be my third visit to try to get rid of it. - Entered by patient    HPI Jonathan Mills is a 63 y.o. male  presents for evaluation of URI symptoms for 1.5 weeks. Patient reports associated symptoms of left-sided sinus pressure/pain with postnasal drip and headache. Denies N/V/D, fevers, sore throat, cough, body aches, shortness of breath. Patient does not have a hx of asthma. Patient is not an active smoker.   Reports no known sick contacts.  Pt has taken nasal sprays and Mucinex OTC for symptoms.  Was treated in October for sinusitis with complete resolution of symptoms on antibiotics.  Pt has no other concerns at this time.   HPI  Past Medical History:  Diagnosis Date   ALLERGIC RHINITIS 10/07/2008   COLONIC POLYPS, HX OF 10/07/2008   GERD 10/07/2008   Headache(784.0)    sinus   HYPERLIPIDEMIA 10/07/2008   Hypertriglyceridemia 07/12/2011   Mood swings    Vitamin D  deficiency 07/12/2011    Patient Active Problem List   Diagnosis Date Noted   Heart palpitations 02/23/2021   Acute pain of right foot 02/23/2021   Migraines 02/05/2019   Back pain 10/28/2015   Memory difficulty 10/28/2014   Vitamin D  deficiency 07/12/2011   Hyperlipidemia 10/07/2008   Allergic rhinitis 10/07/2008   GERD 10/07/2008   History of colonic polyps 10/07/2008    Past Surgical History:  Procedure Laterality Date   EYE SURGERY     LASIK     right ear/neck surgury  05/16/1995   SHOULDER ARTHROSCOPY WITH SUBACROMIAL DECOMPRESSION, ROTATOR CUFF REPAIR AND BICEP TENDON REPAIR Left 12/18/2013   Procedure: LEFT SHOULDER ARTHROSCOPY WITH SUBACROMIAL DECOMPRESSION,DISTAL CLAVICAL RESECTION ROTATOR CUFF REPAIR ;  Surgeon: Franky CHRISTELLA Pointer, MD;  Location:  [facility] OR;  Service: Orthopedics;  Laterality: Left;   SHOULDER SURGERY Right 03/04/2021       Home Medications    Prior to Admission medications   Medication Sig Start Date End Date Taking? Authorizing Provider  doxycycline  (VIBRAMYCIN ) 100 MG capsule Take 1 capsule (100 mg total) by mouth 2 (two) times daily for 7 days. 04/17/24 04/24/24 Yes Zoejane Gaulin, Jodi R, NP  cholecalciferol (VITAMIN D3) 25 MCG (1000 UNIT) tablet Take 1,000 Units by mouth daily.    [provider]  levocetirizine (XYZAL) 5 MG tablet Take 5 mg by mouth every evening.    [provider]  omeprazole  (PRILOSEC) 20 MG capsule Take 1 capsule (20 mg total) by mouth daily. 12/27/23   Wendolyn Jenkins Jansky, MD  rosuvastatin  (CRESTOR ) 10 MG tablet Take 1 tablet (10 mg total) by mouth daily. 12/27/23   Wendolyn Jenkins Jansky, MD  SUMAtriptan  (IMITREX ) 100 MG tablet Take 1 tablet earliest onset of migraine.  May repeat once in 2 hours if headache persists or recurs. 04/27/23   Kate Lonni CROME, MD  vardenafil  (LEVITRA ) 20 MG tablet Take 1 tablet (20 mg total) by mouth daily as needed for erectile dysfunction. 12/27/23   Wendolyn Jenkins Jansky, MD    Family History Family History  Problem Relation Age of Onset   Cancer Father 60       brain    Social History  Social History   Tobacco Use   Smoking status: Never   Smokeless tobacco: Never  Vaping Use   Vaping status: Never Used  Substance Use Topics   Alcohol use: Not Currently    Alcohol/week: 6.0 standard drinks of alcohol    Types: 6 Shots of liquor per week   Drug use: No     Allergies   Penicillins   Review of Systems Review of Systems  HENT:  Positive for postnasal drip, sinus pressure and sinus pain.   Neurological:  Positive for headaches.     Physical Exam Triage Vital Signs ED Triage Vitals  Encounter Vitals Group     BP 04/17/24 0927 135/87     Girls Systolic BP Percentile --      Girls Diastolic BP Percentile --      Boys Systolic BP  Percentile --      Boys Diastolic BP Percentile --      Pulse Rate 04/17/24 0927 76     Resp 04/17/24 0927 18     Temp 04/17/24 0927 97.8 F (36.6 C)     Temp Source 04/17/24 0927 Oral     SpO2 04/17/24 0927 97 %     Weight --      Height --      Head Circumference --      Peak Flow --      Pain Score 04/17/24 0925 0     Pain Loc --      Pain Education --      Exclude from Growth Chart --    No data found.  Updated Vital Signs BP 135/87   Pulse 76   Temp 97.8 F (36.6 C) (Oral)   Resp 18   SpO2 97%   Visual Acuity Right Eye Distance:   Left Eye Distance:   Bilateral Distance:    Right Eye Near:   Left Eye Near:    Bilateral Near:     Physical Exam Vitals and nursing note reviewed.  Constitutional:      General: He is not in acute distress.    Appearance: Normal appearance. He is not ill-appearing or toxic-appearing.  HENT:     Head: Normocephalic and atraumatic.     Right Ear: Tympanic membrane and ear canal normal.     Left Ear: Tympanic membrane and ear canal normal.     Nose: Congestion present.     Right Turbinates: Not swollen or pale.     Left Turbinates: Swollen and pale.     Right Sinus: No maxillary sinus tenderness or frontal sinus tenderness.     Left Sinus: Maxillary sinus tenderness present. No frontal sinus tenderness.     Mouth/Throat:     Mouth: Mucous membranes are moist.     Pharynx: Postnasal drip present. No oropharyngeal exudate or posterior oropharyngeal erythema.  Eyes:     Pupils: Pupils are equal, round, and reactive to light.  Cardiovascular:     Rate and Rhythm: Normal rate and regular rhythm.     Heart sounds: Normal heart sounds.  Pulmonary:     Effort: Pulmonary effort is normal.     Breath sounds: Normal breath sounds.  Musculoskeletal:     Cervical back: Normal range of motion and neck supple.  Lymphadenopathy:     Cervical: No cervical adenopathy.  Skin:    General: Skin is warm and dry.  Neurological:     General:  No focal deficit present.     Mental Status: He  is alert and oriented to person, place, and time.  Psychiatric:        Mood and Affect: Mood normal.        Behavior: Behavior normal.      UC Treatments / Results  Labs (all labs ordered are listed, but only abnormal results are displayed) Labs Reviewed - No data to display  EKG   Radiology No results found.  Procedures Procedures (including critical care time)  Medications Ordered in UC Medications - No data to display  Initial Impression / Assessment and Plan / UC Course  I have reviewed the triage vital signs and the nursing notes.  Pertinent labs & imaging results that were available during my care of the patient were reviewed by me and considered in my medical decision making (see chart for details).     Reviewed exam and symptoms with patient.  Will start doxycycline  twice daily for 7 days.  Continue nasal rinses/sprays as needed.  Advised PCP or ENT follow-up given recurrent infections.  ER precautions reviewed. Final Clinical Impressions(s) / UC Diagnoses   Final diagnoses:  Acute recurrent maxillary sinusitis     Discharge Instructions      Start doxycycline  twice daily for 7 days.  Continue nasal sprays and nasal rinses as needed.  Please follow-up with your PCP or ear nose and throat given your recurrent infections.  Please go to the ER for any worsening symptoms.  Hope you feel better soon!    ED Prescriptions     Medication Sig Dispense Auth. Provider   doxycycline  (VIBRAMYCIN ) 100 MG capsule Take 1 capsule (100 mg total) by mouth 2 (two) times daily for 7 days. 14 capsule Antar Milks, Jodi R, NP      PDMP not reviewed this encounter.   Loreda Myla SAUNDERS, NP 04/17/24 [phone]

## 2024-04-18 ENCOUNTER — Encounter (INDEPENDENT_AMBULATORY_CARE_PROVIDER_SITE_OTHER): Payer: Self-pay

## 2024-04-18 ENCOUNTER — Other Ambulatory Visit: Payer: Self-pay | Admitting: Family

## 2024-04-18 DIAGNOSIS — J329 Chronic sinusitis, unspecified: Secondary | ICD-10-CM

## 2024-04-18 NOTE — Telephone Encounter (Signed)
 Patient has been notified of referral via MyChart

## 2024-04-18 NOTE — Telephone Encounter (Signed)
 Please advise on sending referral. Please and thank you.

## 2024-05-01 ENCOUNTER — Ambulatory Visit (INDEPENDENT_AMBULATORY_CARE_PROVIDER_SITE_OTHER): Admitting: Physician Assistant

## 2024-05-01 ENCOUNTER — Encounter (INDEPENDENT_AMBULATORY_CARE_PROVIDER_SITE_OTHER): Payer: Self-pay | Admitting: Physician Assistant

## 2024-05-01 VITALS — BP 110/71 | HR 70 | Ht 72.0 in | Wt 180.0 lb

## 2024-05-01 DIAGNOSIS — H90A22 Sensorineural hearing loss, unilateral, left ear, with restricted hearing on the contralateral side: Secondary | ICD-10-CM

## 2024-05-01 DIAGNOSIS — J302 Other seasonal allergic rhinitis: Secondary | ICD-10-CM | POA: Diagnosis not present

## 2024-05-01 DIAGNOSIS — H6990 Unspecified Eustachian tube disorder, unspecified ear: Secondary | ICD-10-CM

## 2024-05-01 DIAGNOSIS — J342 Deviated nasal septum: Secondary | ICD-10-CM

## 2024-05-01 DIAGNOSIS — H6991 Unspecified Eustachian tube disorder, right ear: Secondary | ICD-10-CM

## 2024-05-01 MED ORDER — FLUTICASONE PROPIONATE 50 MCG/ACT NA SUSP: 2.0000 | Freq: Every day | NASAL | 6 refills | Status: AC

## 2024-05-01 NOTE — Patient Instructions (Signed)
 Start saline irrigation with distilled water  with neil med bottle or netti pot, twice daily.   3 month follow up in office  I will call with results of hearing evaluation

## 2024-05-02 NOTE — Progress Notes (Unsigned)
 Dear Dr. Douglass, Here is my assessment for our mutual patient, Jonathan Mills. Thank you for allowing me the opportunity to care for your patient. Please do not hesitate to contact me should you have any other questions. Sincerely, Chyrl Cohen PA-C  Otolaryngology Clinic Note Referring provider: Dr. Douglass HPI:  Jonathan Mills is a 63 y.o. male kindly referred by Dr. Douglass   Discussed the use of AI scribe software for clinical note transcription with the patient, who gave verbal consent to proceed.  History of Present Illness   Jonathan Mills is a 63 year old male with a history of nasal surgery and allergies who presents with recurrent left-sided nasal congestion and sinus issues.  The patient has been experiencing severe left-sided sinus issues for approximately four months, beginning after a flight from [place] . They initially had ear pain and pressure that persisted post-flight, followed by fatigue and a suspected ear infection, which was confirmed by their primary care physician. They were prescribed antibiotics and tested positive for COVID-19, which they believe was contracted during the flight.  After the initial episode, symptoms resolved but recurred six weeks later with left nostril congestion. They self-treated with Sudafed, but symptoms worsened, leading to a telehealth visit where they were prescribed doxycycline  and prednisone . This treatment was effective, but symptoms returned about ten days ago, primarily affecting their ability to breathe through the left nostril. A subsequent urgent care visit resulted in another prescription of doxycycline , which provided limited relief.  They have a history of nasal surgery, including a deviated septum repair and turbinate reduction, performed between five and ten years ago. They have experienced recurrent sinus infections and have a long-standing issue with allergies, for which they have used Xyzal and previously used Flonase . They report a history of nasal  congestion since childhood and have undergone allergy testing as a teenager.  Regarding their hearing, they note a long-standing difference in hearing between their left and right ears, with the left being worse. A hearing test conducted two years ago confirmed this asymmetry. They have not experienced significant changes in hearing but acknowledge a slight worsening over time. They have not followed up with an ENT regarding this issue previously.  They are allergic to penicillin, having experienced a full-body rash in the past. They have been prescribed antibiotics related to penicillin without adverse reactions. They have not had their tonsils removed and do not report any recent trauma to their nose. Reports difficulty breathing through the left nostril and postnasal drip causing throat discomfort.           Independent Review of Additional Tests or Records:  ***   PMH/Meds/All/SocHx/FamHx/ROS:   Past Medical History:  Diagnosis Date   ALLERGIC RHINITIS 10/07/2008   COLONIC POLYPS, HX OF 10/07/2008   GERD 10/07/2008   Headache(784.0)    sinus   HYPERLIPIDEMIA 10/07/2008   Hypertriglyceridemia 07/12/2011   Mood swings    Vitamin D  deficiency 07/12/2011     Past Surgical History:  Procedure Laterality Date   EYE SURGERY     LASIK     right ear/neck surgury  05/16/1995   SHOULDER ARTHROSCOPY WITH SUBACROMIAL DECOMPRESSION, ROTATOR CUFF REPAIR AND BICEP TENDON REPAIR Left 12/18/2013   Procedure: LEFT SHOULDER ARTHROSCOPY WITH SUBACROMIAL DECOMPRESSION,DISTAL CLAVICAL RESECTION ROTATOR CUFF REPAIR ;  Surgeon: Franky CHRISTELLA Pointer, MD;  Location: [facility] OR;  Service: Orthopedics;  Laterality: Left;   SHOULDER SURGERY Right 03/04/2021    Family History  Problem Relation Age of Onset   Cancer  Father 73       brain     Social Connections: Not on file     Current Medications[1]   Physical Exam:   BP 110/71 (BP Location: Left Arm, Patient Position: Sitting, Cuff Size: Normal)   Pulse 70    Ht 6' (1.829 m)   Wt 180 lb (81.6 kg)   SpO2 97%   BMI 24.41 kg/m   Pertinent Findings  CN II-XII grossly intact Bilateral EAC clear and TM intact with well pneumatized middle ear spaces Weber 512: equal Rinne 512: AC > BC b/l  Anterior rhinoscopy: Septum ***; bilateral inferior turbinates with *** No lesions of oral cavity/oropharynx; dentition *** No obviously palpable neck masses/lymphadenopathy/thyromegaly No respiratory distress or stridor  Physical Exam   GENERAL: Alert, cooperative, well developed, no acute distress. HEENT: Normocephalic, normal oropharynx, moist mucous membranes. Ears normal. Tympanic membranes normal, no infection, no fluid behind tympanic membranes. Septum significantly deviated to the left with mild nasal swelling. NECK: Supple, non-tender. CHEST: Clear to auscultation bilaterally, no wheezes, rhonchi, or crackles. CARDIOVASCULAR: Normal heart rate and rhythm, S1 and S2 normal without murmurs. ABDOMEN: Soft, non-tender, non-distended, without organomegaly, normal bowel sounds. EXTREMITIES: No cyanosis or edema. NEUROLOGICAL: Cranial nerves grossly intact, moves all extremities without gross motor or sensory deficit.       Seprately Identifiable Procedures:  None***  Impression & Plans:  Jonathan Mills is a 63 y.o. male with the following   Assessment and Plan    Eustachian tube dysfunction Left-sided nasal congestion and breathing difficulty post-flight, likely due to Eustachian tube dysfunction exacerbated by allergies and possible recent COVID-19 infection. Inflammation indicated by steroid relief. History of nasal surgeries and allergies contribute. - Restart Flonase  nasal spray. - Begin saline irrigation with distilled water  using a Neilmed bottle or neti pot twice daily. - Continue Xyzal for allergies. - Schedule follow-up appointment in three months.  Allergic rhinitis Allergy symptoms worsen in fall and spring. Xyzal effective, but  discontinuation of Flonase  may have contributed to nasal symptoms. Allergies likely contribute to nasal inflammation and Eustachian tube dysfunction. - Restart Flonase  nasal spray. - Continue Xyzal for allergy management. - Consider referral to an allergist for further evaluation and possible allergy testing if symptoms persist.  Deviated septum Significantly deviated septum to the left contributes to nasal obstruction and difficulty breathing, exacerbating allergies and Eustachian tube dysfunction. - Consider septoplasty if symptoms do not improve with medical management.  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 Previous test indicated no fluid, suggesting hearing nerve issue. Low likelihood of tumor given chronicity and lack of severe symptoms. - Order repeat hearing test to assess current hearing levels and check for conductive hearing loss. - Consider MRI of the ear and brain if hearing test shows significant changes or concerns.           - f/u ***   Thank you for allowing me the opportunity to care for your patient. Please do not hesitate to contact me should you have any other questions.  Sincerely, Chyrl Cohen PA-C [facility] Phone: [phone] Fax: [phone]  05/02/2024, 4:12 PM        [1]  Current Outpatient Medications:    cholecalciferol (VITAMIN D3) 25 MCG (1000 UNIT) tablet, Take 1,000 Units by mouth daily., Disp: , Rfl:    fluticasone  (FLONASE ) 50 MCG/ACT nasal spray, Place 2 sprays into both nostrils daily., Disp: 16 g, Rfl: 6  levocetirizine (XYZAL) 5 MG tablet, Take 5 mg by mouth every evening., Disp: , Rfl:    omeprazole  (PRILOSEC) 20 MG capsule, Take 1 capsule (20 mg total) by mouth daily., Disp: 90 capsule, Rfl: 3   rosuvastatin  (CRESTOR ) 10 MG tablet, Take 1 tablet (10 mg total) by mouth daily., Disp: 90 tablet, Rfl: 1   SUMAtriptan  (IMITREX ) 100 MG tablet,  Take 1 tablet earliest onset of migraine.  May repeat once in 2 hours if headache persists or recurs., Disp: 10 tablet, Rfl: 5   vardenafil  (LEVITRA ) 20 MG tablet, Take 1 tablet (20 mg total) by mouth daily as needed for erectile dysfunction., Disp: 30 tablet, Rfl: 3

## 2024-05-13 ENCOUNTER — Encounter (INDEPENDENT_AMBULATORY_CARE_PROVIDER_SITE_OTHER): Payer: Self-pay

## 2024-05-14 ENCOUNTER — Other Ambulatory Visit (INDEPENDENT_AMBULATORY_CARE_PROVIDER_SITE_OTHER): Payer: Self-pay | Admitting: Physician Assistant

## 2024-05-14 ENCOUNTER — Telehealth (INDEPENDENT_AMBULATORY_CARE_PROVIDER_SITE_OTHER): Payer: Self-pay | Admitting: Physician Assistant

## 2024-05-14 MED ORDER — METHYLPREDNISOLONE 4 MG PO TBPK: ORAL_TABLET | ORAL | 1 refills | Status: AC

## 2024-05-14 MED ORDER — FLUTICASONE PROPIONATE 50 MCG/ACT NA SUSP: 2.0000 | Freq: Every day | NASAL | 6 refills | Status: AC

## 2024-05-14 NOTE — Progress Notes (Signed)
 I spoke with the patient, he has persistent left-sided nasal obstructive symptoms.  We discussed attempting Medrol  Dosepak.  If his symptoms are not resolving with medical management I would recommend CT sinuses and follow-up in our office.  He will reach out with any further questions.  He is also waiting evaluation by audiology for asymmetric hearing loss.

## 2024-05-14 NOTE — Telephone Encounter (Signed)
 Left message for patient to call back to schedule next available Adult Hearing with [facility].

## 2024-05-16 ENCOUNTER — Other Ambulatory Visit (INDEPENDENT_AMBULATORY_CARE_PROVIDER_SITE_OTHER): Payer: Self-pay | Admitting: Physician Assistant

## 2024-05-16 ENCOUNTER — Telehealth (INDEPENDENT_AMBULATORY_CARE_PROVIDER_SITE_OTHER): Payer: Self-pay | Admitting: Physician Assistant

## 2024-05-16 MED ORDER — FLUTICASONE PROPIONATE 50 MCG/ACT NA SUSP: 2.0000 | Freq: Every day | NASAL | 4 refills | Status: AC

## 2024-05-16 NOTE — Telephone Encounter (Signed)
 Called patient to schedule a audiogram before seeing Chyrl on 07/29/2024

## 2024-06-23 ENCOUNTER — Ambulatory Visit (INDEPENDENT_AMBULATORY_CARE_PROVIDER_SITE_OTHER): Payer: Self-pay | Admitting: Audiology

## 2024-07-29 ENCOUNTER — Ambulatory Visit (INDEPENDENT_AMBULATORY_CARE_PROVIDER_SITE_OTHER): Admitting: Physician Assistant
